# Patient Record
Sex: Female | Born: 1940 | Race: Black or African American | Hispanic: No | Marital: Single
Health system: Midwestern US, Community
[De-identification: ages and names within clinical notes are randomized; demographics above are authoritative.]

## PROBLEM LIST (undated history)

## (undated) DIAGNOSIS — I10 Essential (primary) hypertension: Secondary | ICD-10-CM

## (undated) DIAGNOSIS — M810 Age-related osteoporosis without current pathological fracture: Secondary | ICD-10-CM

## (undated) DIAGNOSIS — J449 Chronic obstructive pulmonary disease, unspecified: Secondary | ICD-10-CM

## (undated) DIAGNOSIS — R569 Unspecified convulsions: Secondary | ICD-10-CM

## (undated) DIAGNOSIS — I059 Rheumatic mitral valve disease, unspecified: Secondary | ICD-10-CM

## (undated) DIAGNOSIS — IMO0002 Reserved for concepts with insufficient information to code with codable children: Secondary | ICD-10-CM

## (undated) DIAGNOSIS — M329 Systemic lupus erythematosus, unspecified: Secondary | ICD-10-CM

## (undated) HISTORY — DX: Unspecified convulsions: R56.9

## (undated) HISTORY — PX: CHOLECYSTECTOMY: SHX55

## (undated) HISTORY — DX: Systemic lupus erythematosus, unspecified: M32.9

## (undated) HISTORY — DX: Essential (primary) hypertension: I10

## (undated) HISTORY — DX: Reserved for concepts with insufficient information to code with codable children: IMO0002

## (undated) HISTORY — DX: Rheumatic mitral valve disease, unspecified: I05.9

## (undated) HISTORY — PX: TUBAL LIGATION: SHX77

## (undated) MED ORDER — METOPROLOL SUCCINATE SR 200 MG 24 HR TAB
200 mg | ORAL_TABLET | Freq: Every day | ORAL | Status: DC
Start: ? — End: 2012-05-08

## (undated) MED ORDER — FLUTICASONE-SALMETEROL 250 MCG-50 MCG/DOSE DISK DEVICE FOR INHALATION
250-50 mcg/dose | Freq: Two times a day (BID) | RESPIRATORY_TRACT | Status: AC
Start: ? — End: 2012-08-06

## (undated) MED ORDER — WARFARIN 2 MG TAB
2 mg | ORAL_TABLET | ORAL | Status: DC
Start: ? — End: 2012-07-03

## (undated) MED ORDER — TRAZODONE 50 MG TAB
50 mg | ORAL_TABLET | Freq: Every evening | ORAL | Status: DC
Start: ? — End: 2012-05-08

## (undated) MED ORDER — DICYCLOMINE 10 MG CAP
10 mg | ORAL_CAPSULE | Freq: Three times a day (TID) | ORAL | Status: DC
Start: ? — End: 2013-05-30

## (undated) MED ORDER — CIPROFLOXACIN 500 MG TAB
500 mg | ORAL_TABLET | Freq: Two times a day (BID) | ORAL | Status: AC
Start: ? — End: 2012-03-31

## (undated) MED ORDER — ESOMEPRAZOLE MAGNESIUM 40 MG CAP, DELAYED RELEASE
40 mg | ORAL_CAPSULE | Freq: Every day | ORAL | Status: DC
Start: ? — End: 2013-05-30

## (undated) MED ORDER — WARFARIN 2 MG TAB: 2 mg | ORAL_TABLET | ORAL | Status: AC

## (undated) MED ORDER — SERTRALINE 50 MG TAB
50 mg | ORAL_TABLET | Freq: Every day | ORAL | Status: DC
Start: ? — End: 2012-08-02

## (undated) MED ORDER — FLUTICASONE 50 MCG/ACTUATION NASAL SPRAY, SUSP: 50 mcg/actuation | Freq: Every day | NASAL | Status: AC

## (undated) MED ORDER — DIGOXIN 0.125 MG TAB
0.125 mg | ORAL_TABLET | Freq: Every day | ORAL | Status: DC
Start: ? — End: 2013-05-30

## (undated) MED ORDER — DIGOXIN 0.125 MG TAB: 0.125 mg | ORAL_TABLET | Freq: Every day | ORAL | Status: AC

## (undated) MED ORDER — SERTRALINE 100 MG TAB: 100 mg | ORAL_TABLET | Freq: Every day | ORAL | Status: AC

## (undated) MED ORDER — LEVETIRACETAM 500 MG TAB
500 mg | ORAL_TABLET | Freq: Two times a day (BID) | ORAL | Status: AC
Start: ? — End: 2012-08-06

## (undated) MED ORDER — ESOMEPRAZOLE MAGNESIUM 40 MG CAP, DELAYED RELEASE
40 mg | ORAL_CAPSULE | Freq: Every day | ORAL | Status: DC
Start: ? — End: 2012-05-08

## (undated) MED ORDER — TRAZODONE 50 MG TAB
50 mg | ORAL_TABLET | Freq: Every evening | ORAL | Status: DC
Start: ? — End: 2013-07-16

## (undated) MED ORDER — MIRTAZAPINE 30 MG TAB
30 mg | ORAL_TABLET | ORAL | Status: DC
Start: ? — End: 2013-05-30

## (undated) MED ORDER — ALBUTEROL SULFATE HFA 90 MCG/ACTUATION AEROSOL INHALER: 90 mcg/actuation | Freq: Four times a day (QID) | RESPIRATORY_TRACT | Status: AC | PRN

## (undated) MED ORDER — DONEPEZIL 5 MG TAB
5 mg | ORAL_TABLET | Freq: Every evening | ORAL | Status: DC
Start: ? — End: 2013-05-30

## (undated) MED ORDER — FLUTICASONE-SALMETEROL 250 MCG-50 MCG/DOSE DISK DEVICE FOR INHALATION
250-50 mcg/dose | Freq: Two times a day (BID) | RESPIRATORY_TRACT | Status: DC
Start: ? — End: 2012-05-08

## (undated) MED ORDER — WARFARIN 2 MG TAB
2 mg | ORAL_TABLET | ORAL | Status: DC
Start: ? — End: 2012-08-14

## (undated) MED ORDER — SERTRALINE 50 MG TAB
50 mg | ORAL_TABLET | Freq: Every day | ORAL | Status: DC
Start: ? — End: 2012-05-08

## (undated) MED ORDER — MEMANTINE 5 MG TAB
5 mg | ORAL_TABLET | Freq: Every day | ORAL | Status: DC
Start: ? — End: 2013-05-30

## (undated) MED ORDER — PREDNISONE 10 MG TABLETS IN A DOSE PACK
10 mg | PACK | ORAL | Status: DC
Start: ? — End: 2013-05-30

## (undated) MED ORDER — TRAMADOL 50 MG TAB: 50 mg | ORAL_TABLET | Freq: Four times a day (QID) | ORAL | Status: AC | PRN

## (undated) MED ORDER — METOPROLOL SUCCINATE SR 200 MG 24 HR TAB
200 mg | ORAL_TABLET | Freq: Every day | ORAL | Status: DC
Start: ? — End: 2013-05-30

---

## 2009-06-08 LAB — EKG, 12 LEAD, INITIAL
Atrial Rate: 96 {beats}/min
Calculated P Axis: 59 degrees
Calculated R Axis: 21 degrees
Calculated T Axis: 96 degrees
P-R Interval: 140 ms
Q-T Interval: 370 ms
QRS Duration: 74 ms
QTC Calculation (Bezet): 467 ms
Ventricular Rate: 96 {beats}/min

## 2009-06-08 NOTE — ED Notes (Signed)
Called to the treatment area with no response

## 2009-06-08 NOTE — ED Notes (Signed)
Called to the treatment area with no response, patient apparently walked out without being seen

## 2009-06-08 NOTE — ED Notes (Signed)
TRIAGE NOTE:  Pt complains of cough, wheezing.  Also having HA.

## 2009-06-09 NOTE — ED Provider Notes (Signed)
The patient was never seen and evaluated by me while in the ED.

## 2010-08-27 NOTE — Progress Notes (Signed)
History and Physical    Subjective:     Alyssa Hawkins is a 70 y.o. with hx of hbp who is about 10 yr s/p ?AVR in [place] and maintianed on coumadin by Garn[facility] [place] referred by [facility] for connective tissue evaluation. Over the past few years Alyssa Hawkins has developed multiple black papular lesions on face and in scalp in additional to scattered excematoid-like lesions on arms and palms recently biopsied and treated with topical steroids. Path report is not available to me but lab has included nl cbc, u/a and cmp and + ANA, RNP and anti SS-A. The patients mother died with SLE at age 40.    Alyssa Hawkins/o painful toe tips, but no hx of Sicca, Raynaud's, alopecia, oral ulcers, pleurisy or significant joint pain.    Past Medical History   Diagnosis Date   ??? Hypertension    ??? COPD    ??? CAD (coronary artery disease)    ??? HTN (hypertension) 08/27/2010   ??? S/P AVR (aortic valve replacement) 08/27/2010      Past Surgical History   Procedure Date   ??? Valve replacement only each    ??? Hx aortic valve replacement    ??? Hx tubal ligation      No family history on file.   History   Substance Use Topics   ??? Smoking status: Current Everyday Smoker -- 1.0 packs/day   ??? Smokeless tobacco: Not on file   ??? Alcohol Use: Yes       Prior to Admission medications    Medication Sig Start Date End Date Taking? Authorizing Provider   escitalopram (LEXAPRO) 10 mg tablet Take 10 mg by mouth daily.     Yes Historical Provider   benazepril (LOTENSIN) 10 mg tablet Take  by mouth daily.     Yes Historical Provider   warfarin (COUMADIN) 1 mg tablet Take 1 mg by mouth daily.     Yes Historical Provider   metoprolol (LOPRESSOR) 50 mg tablet Take  by mouth two (2) times a day.     Yes Historical Provider     Allergies   Allergen Reactions   ??? Penicillins Unknown (comments)        Review of Systems  .Gen - no significant weight change, restorative sleep   HEENT - vital senses intact, no headache   CARDIOPUL - no cp, sob, cough    GI - no significant dyspepsia or pain, normal bowel habit   GU - no dysuria or unusual frequency   MUSCULOSKEL - no significant joint complaints      Objective:   BP 100/70   Ht 5\' 6"  (1.676 m)   Wt 139 lb (63.05 kg)   BMI 22.44 kg/m2      Physical Exam:   Heent -perrla  Neck -nl thyroid, no nodes  Breasts-neg  Lungs -clear  Heart -reg, prosthetic heart sound with systolic m base and apex  Abd -no masses, no bruit  Ext -no edema, no synovitis,feet are cool but not cyanotic  Skin - as decribed above    Data Review:   Results for orders placed during the hospital encounter of 06/08/09   EKG, 12 LEAD, INITIAL       Component Value Range    Ventricular Rate 96      Atrial Rate 96      P-R Interval 140      QRS Duration 74      Q-T Interval 370  QTC Calculation (Bezet) 467      Calculated P Axis 59      Calculated R Axis 21      Calculated T Axis 96      Diagnosis        Value: Sinus rhythm with occasional premature ventricular complexes      Possible Left atrial enlargement      Nonspecific T wave abnormality Prolonged QT            No previous ECGs available      Confirmed by Arnoldo Lenis, M.D., Massimo ([id number]) on 06/08/2009 3:58:39 PM       Assessment:     1. Discoid lupus    2. HTN (hypertension)    3. S/P AVR (aortic valve replacement)        Plan:     I find no evidence for systemic CTD at this time. Serology consistent with subacute lupus but does not have characteristic rash. Also the black papules are very unusual and will await bx results.Plaquenil probably a good choice but will have to monitor INR.I did not give return appt but will be interested in her course and available as needed.    Signed By: Prudencio Pair, MD     August 27, 2010

## 2010-12-30 DIAGNOSIS — I4891 Unspecified atrial fibrillation: Secondary | ICD-10-CM

## 2010-12-30 NOTE — ED Notes (Signed)
Pt will be transported with fluids infusing.

## 2010-12-30 NOTE — ED Notes (Signed)
1 hour of SOB and irregular rapid heart rate.

## 2010-12-30 NOTE — ED Notes (Signed)
Pt now in NSR 87.MD notified and new EKG ran.

## 2010-12-30 NOTE — H&P (Signed)
History and Physical dictated (# [id number])    Pearline Cables, MD  Hospitalist  [phone]

## 2010-12-30 NOTE — ED Notes (Signed)
She was at home with sob and chest discomfort and irregular heartbeat.  She is  Alert and oriente

## 2010-12-30 NOTE — ED Provider Notes (Addendum)
HPI Comments: 70 year old female with hx of valve replacement in 2000 (on Coumadin) and HTN presents to the ED for SOB and chest pain.  Pt reports sudden onset of rapid heart rate, SOB, and chest "pressure" at 1800 tonight.  Associated nausea, no vomiting.  No hx of the same, though she may have had some mild episodes before.  Pt denies syncope and leg swelling.    PCP: [facility]    Note written by Sherlon Handing, Scribe, as dictated by Cindi Carbon, MD 8:43 PM      The history is provided by the patient.        Past Medical History   Diagnosis Date   ??? Hypertension    ??? COPD    ??? CAD (coronary artery disease)    ??? HTN (hypertension) 08/27/2010   ??? S/P AVR (aortic valve replacement) 08/27/2010        Past Surgical History   Procedure Date   ??? Valve replacement only each    ??? Hx aortic valve replacement    ??? Hx tubal ligation          No family history on file.     History     Social History   ??? Marital Status: Single     Spouse Name: N/A     Number of Children: N/A   ??? Years of Education: N/A     Occupational History   ??? Not on file.     Social History Main Topics   ??? Smoking status: Current Everyday Smoker -- 1.0 packs/day   ??? Smokeless tobacco: Not on file   ??? Alcohol Use: Yes   ??? Drug Use:    ??? Sexually Active:      Other Topics Concern   ??? Not on file     Social History Narrative   ??? No narrative on file                  ALLERGIES: Penicillins      Review of Systems   Respiratory: Positive for shortness of breath.    Cardiovascular: Positive for chest pain and palpitations.   Gastrointestinal: Positive for nausea.   All other systems reviewed and are negative.        Filed Vitals:    12/30/10 2020 12/30/10 2032   BP: 98/68    Pulse:  140   Temp: 98.3 ??F (36.8 ??C)    Resp: 28    Height:  5\' 6"  (1.676 m)   Weight:  59.421 kg (131 lb)   SpO2: 98%             Physical Exam   Nursing note and vitals reviewed.   Constitutional: She is oriented to person, place, and time. She appears well-developed and well-nourished. No distress.   HENT:   Head: Normocephalic and atraumatic.   Eyes: Pupils are equal, round, and reactive to light.   Neck: Neck supple.   Cardiovascular: An irregular rhythm present. Tachycardia present.    No murmur heard.       Loud S2.   Pulmonary/Chest: Effort normal and breath sounds normal. No respiratory distress.   Abdominal: Soft. She exhibits no mass. There is no tenderness.   Musculoskeletal: Normal range of motion. She exhibits no edema.   Neurological: She is alert and oriented to person, place, and time.   Skin: Skin is warm and dry.   Psychiatric: She has a normal mood and affect. Her behavior is  normal.   Note written by Sherlon Handing, Scribe, as dictated by Cindi Carbon, MD 8:44 PM      MDM     Amount and/or Complexity of Data Reviewed:   Clinical lab tests:  Ordered and reviewed   Obtain history from someone other than the patient:  Yes   Discuss the patient with another provider:  Yes   Independant visualization of image, tracing, or specimen:  Yes  Risk of Significant Complications, Morbidity, and/or Mortality:   Presenting problems:  Moderate  Diagnostic procedures:  Moderate  Management options:  Moderate      Procedures    9:19 PM  EKG: (ED MD Interpretation)  A-fib, rate= 182, NSSTTW changes.  Note written by Sherlon Handing, Scribe, as interpreted by Cindi Carbon, MD 9:19 PM    9:20 PM  Repeat EKG after Cardizem: (ED MD Interpretation)  NSR, rate= 83, NSSTTW changes.  Note written by Sherlon Handing, Scribe, as interpreted by Cindi Carbon, MD 9:20 PM  1100 pm- repeat troponin 0.19 - have spoken c hospitalist - he will adm.

## 2010-12-30 NOTE — Other (Signed)
TRANSFER - OUT REPORT:    Verbal report given to Amanda(name) on Alyssa Hawkins  being transferred to [facility](unit) for routine progression of care       Report consisted of patient???s Situation, Background, Assessment and   Recommendations(SBAR).     Information from the following report(s) SBAR, ED Summary, [facility] and Recent Results was reviewed with the receiving nurse.    Opportunity for questions and clarification was provided.

## 2010-12-31 ENCOUNTER — Inpatient Hospital Stay
Admit: 2010-12-31 | Discharge: 2010-12-31 | Disposition: A | Discharge status: Home / Self Care | Payer: MEDICARE | Attending: Internal Medicine | Admitting: Internal Medicine

## 2010-12-31 LAB — METABOLIC PANEL, COMPREHENSIVE
A-G Ratio: 0.9 — ABNORMAL LOW (ref 1.1–2.2)
ALT (SGPT): 14 U/L (ref 12–78)
AST (SGOT): 30 U/L (ref 15–37)
Albumin: 4.2 g/dL (ref 3.5–5.0)
Alk. phosphatase: 75 U/L (ref 50–136)
Anion gap: 12 mmol/L (ref 5–15)
BUN/Creatinine ratio: 9 — ABNORMAL LOW (ref 12–20)
BUN: 11 MG/DL (ref 6–20)
Bilirubin, total: 0.7 MG/DL (ref 0.2–1.0)
CO2: 25 MMOL/L (ref 21–32)
Calcium: 9.1 MG/DL (ref 8.5–10.1)
Chloride: 101 MMOL/L (ref 97–108)
Creatinine: 1.2 MG/DL (ref 0.6–1.3)
GFR est AA: 57 mL/min/{1.73_m2} — ABNORMAL LOW (ref >60)
GFR est non-AA: 47 mL/min/{1.73_m2} — ABNORMAL LOW (ref >60)
Globulin: 4.7 g/dL — ABNORMAL HIGH (ref 2.0–4.0)
Glucose: 128 MG/DL — ABNORMAL HIGH (ref 65–100)
Potassium: 3.2 MMOL/L — ABNORMAL LOW (ref 3.5–5.1)
Protein, total: 8.9 g/dL — ABNORMAL HIGH (ref 6.4–8.2)
Sodium: 138 MMOL/L (ref 136–145)

## 2010-12-31 LAB — CBC WITH AUTOMATED DIFF
ABS. BASOPHILS: 0 10*3/uL (ref 0.0–0.1)
ABS. EOSINOPHILS: 0 10*3/uL (ref 0.0–0.4)
ABS. LYMPHOCYTES: 1.5 10*3/uL (ref 0.8–3.5)
ABS. MONOCYTES: 0.7 10*3/uL (ref 0.0–1.0)
ABS. NEUTROPHILS: 3.6 10*3/uL (ref 1.8–8.0)
BASOPHILS: 0 % (ref 0–1)
EOSINOPHILS: 0 % (ref 0–7)
HCT: 38.8 % (ref 35.0–47.0)
HGB: 13 g/dL (ref 11.5–16.0)
LYMPHOCYTES: 26 % (ref 12–49)
MCH: 27.5 PG (ref 26.0–34.0)
MCHC: 33.5 g/dL (ref 30.0–36.5)
MCV: 82 FL (ref 80.0–99.0)
MONOCYTES: 11 % (ref 5–13)
NEUTROPHILS: 63 % (ref 32–75)
PLATELET: 222 10*3/uL (ref 150–400)
RBC: 4.73 M/uL (ref 3.80–5.20)
RDW: 14.7 % — ABNORMAL HIGH (ref 11.5–14.5)
WBC: 5.8 10*3/uL (ref 3.6–11.0)

## 2010-12-31 LAB — EKG, 12 LEAD, INITIAL
Atrial Rate: 170 {beats}/min
Atrial Rate: 83 {beats}/min
Atrial Rate: 64 {beats}/min
Calculated P Axis: 44 degrees
Calculated P Axis: 51 degrees
Calculated R Axis: 12 degrees
Calculated R Axis: 20 degrees
Calculated R Axis: 36 degrees
Calculated T Axis: 127 degrees
Calculated T Axis: 64 degrees
Calculated T Axis: 75 degrees
Diagnosis: NORMAL
Diagnosis: NORMAL
P-R Interval: 164 ms
P-R Interval: 168 ms
Q-T Interval: 240 ms
Q-T Interval: 390 ms
Q-T Interval: 480 ms
QRS Duration: 68 ms
QRS Duration: 80 ms
QRS Duration: 90 ms
QTC Calculation (Bezet): 417 ms
QTC Calculation (Bezet): 458 ms
QTC Calculation (Bezet): 495 ms
Ventricular Rate: 182 {beats}/min
Ventricular Rate: 83 {beats}/min
Ventricular Rate: 64 {beats}/min

## 2010-12-31 LAB — CK W/ CKMB & INDEX
CK - MB: 1 NG/ML (ref 0.5–3.6)
CK-MB Index: 0.7 (ref 0–2.5)
CK: 141 U/L (ref 26–192)

## 2010-12-31 LAB — MAGNESIUM
Magnesium: 1.7 MG/DL (ref 1.6–2.4)
Magnesium: 1.7 MG/DL (ref 1.6–2.4)

## 2010-12-31 LAB — TROPONIN I
Troponin-I, Qt.: 0.06 ng/mL — ABNORMAL HIGH (ref <0.05)
Troponin-I, Qt.: 0.19 ng/mL — ABNORMAL HIGH (ref <0.05)
Troponin-I, Qt.: 0.58 ng/mL — ABNORMAL HIGH (ref <0.05)
Troponin-I, Qt.: 0.29 ng/mL — ABNORMAL HIGH (ref <0.05)

## 2010-12-31 LAB — TSH 3RD GENERATION: TSH: 1.26 u[IU]/mL (ref 0.36–3.74)

## 2010-12-31 LAB — PROTHROMBIN TIME + INR
INR: 3.1 — ABNORMAL HIGH (ref 0.9–1.1)
INR: 2.9 — ABNORMAL HIGH (ref 0.9–1.1)
Prothrombin time: 31.9 s — ABNORMAL HIGH (ref 9.4–11.7)
Prothrombin time: 30.2 s — ABNORMAL HIGH (ref 9.4–11.7)

## 2010-12-31 LAB — HEMOGLOBIN A1C WITH EAG
Est. average glucose: 128 mg/dL
Hemoglobin A1c: 6.1 % (ref 4.2–6.3)

## 2010-12-31 LAB — PTT: aPTT: 53.6 s — ABNORMAL HIGH (ref 24.0–31.5)

## 2010-12-31 MED ORDER — PANTOPRAZOLE 40 MG TAB, DELAYED RELEASE
40 mg | Freq: Every day | ORAL | Status: DC
Start: 2010-12-31 — End: 2010-12-31
  Administered 2010-12-31: 13:00:00 via ORAL

## 2010-12-31 MED ORDER — ASPIRIN 81 MG TAB, DELAYED RELEASE
81 mg | Freq: Every day | ORAL | Status: DC
Start: 2010-12-31 — End: 2010-12-31
  Administered 2010-12-31: 13:00:00 via ORAL

## 2010-12-31 MED ORDER — ASPIRIN 81 MG CHEWABLE TAB
81 mg | ORAL | Status: AC
Start: 2010-12-31 — End: 2010-12-30
  Administered 2010-12-31: 04:00:00 via ORAL

## 2010-12-31 MED ORDER — NS WITH POTASSIUM CHLORIDE 20 MEQ/L IV
20 mEq/L | INTRAVENOUS | Status: AC
Start: 2010-12-31 — End: 2010-12-31
  Administered 2010-12-31: 05:00:00 via INTRAVENOUS

## 2010-12-31 MED ORDER — NICOTINE 21 MG/24 HR DAILY PATCH
21 mg/24 hr | MEDICATED_PATCH | Freq: Every day | TRANSDERMAL | Status: DC
Start: 2010-12-31 — End: 2011-01-18

## 2010-12-31 MED ORDER — ESCITALOPRAM 10 MG TAB
10 mg | Freq: Every day | ORAL | Status: DC
Start: 2010-12-31 — End: 2010-12-31
  Administered 2010-12-31: 13:00:00 via ORAL

## 2010-12-31 MED ORDER — PANTOPRAZOLE 40 MG TAB, DELAYED RELEASE
40 mg | Freq: Once | ORAL | Status: AC
Start: 2010-12-31 — End: 2010-12-30
  Administered 2010-12-31: 04:00:00 via ORAL

## 2010-12-31 MED ORDER — ONDANSETRON (PF) 4 MG/2 ML INJECTION
4 mg/2 mL | INTRAMUSCULAR | Status: DC | PRN
Start: 2010-12-31 — End: 2010-12-31

## 2010-12-31 MED ORDER — NICOTINE 21 MG/24 HR DAILY PATCH
21 mg/24 hr | Freq: Every day | TRANSDERMAL | Status: DC
Start: 2010-12-31 — End: 2010-12-31

## 2010-12-31 MED ADMIN — diltiazem (CARDIZEM) 125 mg in dextrose 5% 125 mL infusion: INTRAVENOUS | @ 01:00:00 | NDC 10019051004

## 2010-12-31 MED ADMIN — zolpidem (AMBIEN) tablet 10 mg: ORAL | @ 05:00:00 | NDC 68084022611

## 2010-12-31 MED ADMIN — warfarin (COUMADIN) tablet 2 mg: ORAL | @ 20:00:00 | NDC 00056016901

## 2010-12-31 MED ADMIN — diltiazem (CARDIZEM) 125 mg in dextrose 5% 125 mL infusion: INTRAVENOUS | @ 08:00:00 | NDC 10019051004

## 2010-12-31 MED ADMIN — fluticasone-salmeterol (ADVAIR) 250mcg-50mcg/puff: RESPIRATORY_TRACT | @ 13:00:00 | NDC 00173069600

## 2010-12-31 MED ADMIN — diltiazem (CARDIZEM) 125 mg in dextrose 5% 125 mL infusion: INTRAVENOUS | @ 13:00:00 | NDC 68462056201

## 2010-12-31 MED ADMIN — diltiazem (CARDIZEM) injection 10 mg: INTRAVENOUS | @ 01:00:00 | NDC 00409117101

## 2010-12-31 MED ADMIN — spironolactone (ALDACTONE) tablet 25 mg: ORAL | @ 13:00:00 | NDC 68084020611

## 2010-12-31 NOTE — Progress Notes (Addendum)
TRANSFER - IN REPORT:    Verbal report received from Carlette, RN(name) on Alyssa Hawkins  being received from ED(unit) for routine progression of care      Report consisted of patient???s Situation, Background, Assessment and   Recommendations(SBAR).     Information from the following report(s) SBAR, Kardex, ED Summary, MAR, [facility] and Recent Results was reviewed with the receiving nurse.    Opportunity for questions and clarification was provided.      Assessment completed upon patient???s arrival to unit and care assumed.

## 2010-12-31 NOTE — Progress Notes (Signed)
0400-- Pts SBP in the 90s. MD paged  0422-- s/w Dr.Gavris, received orders to decrease cardizem rate to  5 ml/hr.    Will continue to assess and monitor pt.

## 2010-12-31 NOTE — Progress Notes (Signed)
Hospitalist Teaching Service  Family Medicine Resident Progress Note    Patient seen and examined.  Agree with resident's plans.  Please see my additional comments at the bottom of the note.    Daily Progress Note: 12/31/2010  PCP: Not On File     Assessment/Plan:   1. A Fib: pt now in NSR, maybe due to underlying ischemia although nonspecific EKG changes.. Cardiology consulted. Continue metoprolol. Will continue to wean her off Cardizem. Cardiac enzymes normal.   2. Troponin's elevated: Maybe due to coronary ischemia or demand ischemia from RVR. Will await cardiology advise.  3. Hypokalemia: replete with potasium. Will recheck in am  4. Hyperglycemia: does not Hx of DM. Hb A1 c was 6.1.    VTE prophylaxis: coumadin  Discussed plan of care with Patient/Family and Nurse   Disposition:  discharge to home when stable.     Subjective:   Pt is doing well. She had a comfortable night.  Denies SOB, CP, nausea, vomiting, fevers, chills.    No issues overnight.      Objective:   Physical examination  BP 99/50   Pulse 68   Temp 98.4 ??F (36.9 ??C)   Resp 16   Ht 5\' 6"  (1.676 m)   Wt 132 lb 11.5 oz (60.2 kg)   BMI 21.42 kg/m2   SpO2 98%   Temp (24hrs), Avg:98.1 ??F (36.7 ??C), Min:97.5 ??F (36.4 ??C), Max:98.4 ??F (36.9 ??C)         O2 Device: Room air      Intake/Output Summary (Last 24 hours) at 12/31/10 1610  Last data filed at 12/31/10 0145   Gross per 24 hour   Intake      0 ml   Output    200 ml   Net   -200 ml     Last shift:       Last 3 shifts:    08/29 1900 - 08/31 0659  In: -   Out: 200 [Urine:200]    General:   Alert, cooperative, no acute distress   Head:   Atraumatic   Eyes:   Conjunctivae clear   ENT:  Oral mucosa normal   Neck:  Supple, trachea midline, no adenopathy   No JVD   Back:    No CVA tenderness    Chest wall:    No tenderness or deformities    Lungs:   Clear to auscultation bilaterally    Heart:   Regular rhythm, click present   Abdomen:    Soft, non-tender   No masses or organomegaly     Extremities:  No edema or DVT signs   Pulses:  Symmetric all extremities   Neurologic:  Oriented   No focal deficits     Data Review:   Reviewed labs, Chest X ray  Telemetry reviewed    Recent Labs   [facility] 12/30/10 2045    WBC 5.8    HGB 13.0    HCT 38.8    PLT 222     Recent Labs   Basename 12/31/10 0400 12/30/10 2145 12/30/10 2045    NA -- -- 138    K -- -- 3.2*    CL -- -- 101    CO2 -- -- 25    GLU -- -- 128*    BUN -- -- 11    CREA -- -- 1.2    CA -- -- 9.1    MG -- 1.7 1.7    PHOS -- -- --  ALB -- -- 4.2    TBIL -- -- 0.7    SGOT -- -- 30    INR 2.9* -- 3.1*     Medications reviewed  Current Facility-Administered Medications   Medication Dose Route Frequency   ??? escitalopram (LEXAPRO) tablet 10 mg  10 mg Oral DAILY   ??? pantoprazole (PROTONIX) tablet 40 mg  40 mg Oral DAILY   ??? fluticasone-salmeterol (ADVAIR) 248mcg-50mcg/puff  1 Puff Inhalation Q12H   ??? metoprolol-XL (TOPROL-XL) tablet 100 mg  100 mg Oral BID   ??? spironolactone (ALDACTONE) tablet 25 mg  25 mg Oral DAILY   ??? aspirin delayed-release tablet 81 mg  81 mg Oral DAILY   ??? ondansetron (ZOFRAN) injection 4 mg  4 mg IntraVENous Q4H PRN   ??? nicotine (NICODERM CQ) 21 mg/24 hr patch 1 Patch  1 Patch TransDERmal DAILY   ??? 0.9% sodium chloride with KCl 20 mEq/L infusion   IntraVENous CONTINUOUS   ??? WARFARIN INFORMATION NOTE (COUMADIN)   Other PRN   ??? zolpidem (AMBIEN) tablet 10 mg  10 mg Oral QHS PRN   ??? diltiazem (CARDIZEM) 125 mg in dextrose 5% 125 mL infusion  5 mg/hr IntraVENous CONTINUOUS   ??? diltiazem (CARDIZEM) injection 10 mg  10 mg IntraVENous ONCE   ??? aspirin chewable tablet 162 mg  162 mg Oral NOW   ??? pantoprazole (PROTONIX) tablet 40 mg  40 mg Oral ONCE   ??? DISCONTD: diltiazem (CARDIZEM) 125 mg in dextrose 5% 125 mL infusion  10 mg/hr IntraVENous CONTINUOUS   ??? DISCONTD: diltiazem (CARDIZEM) injection 10 mg  10 mg IntraVENous NOW         Signed:   Peggyann Shoals, MD   Resident, Family Medicine      Attending note:    Patient seen and examined.  She is now back in NSR.  Appreciate cardiology assistance.  D/w Dr. Gasper Lloyd.  Plan to d/c home on prior cardiac regimen and coumadin.  She will f/u with Dr. Gasper Lloyd next week for further eval.

## 2010-12-31 NOTE — Progress Notes (Signed)
Location: 4[facility] - 45001  Attn.: Cheral Almas  DOB: 1940/10/14 / Age: 70  MR#: [id number] / Admit#: [id number]  Pt. First Name: Ingra  Pt. Last Name: Sharrie Rothman [facility]  [address]  [place], [place]  [address]                        Case Management - Progress Note  Initial Open Date: 12/30/2010  Case Manager: Melina Fiddler    Initial Open Date:  Social Worker: Derenda Fennel  Expected Date of Discharge: 01/01/2011  Transferred From: home  ECF Bed Held Until:  Bed Held By:  Power of Attorney: child-keena [phone]  POA/Guardian/Conservator Capacity:  Primary Caregiver:  Living Arrangements: Own Home  Source of Income:  Payee:  Psychosocial History:  Cultural/Religious/Language Issues:  Education Level:  ADLS/Current Living Arrangements Issues: self care pta;  Past Providers:  Will patient perform self care at discharge? Y  Anticipated Discharge Disposition Goal: Return to admission address  Assessment/Plan:   12/31/2010 12:08P: Chart reviewed for medical necessity.  No discharge needs identified at this time, but will continue to assess and  refer to sw as needed.  Following for medical necessity and care management  needs.  S.Jaely Silman,RN,CRM  Resources at Discharge:  Service Providers at Discharge:  Dictating Provider:

## 2010-12-31 NOTE — Progress Notes (Signed)
Echocardiogram completed.

## 2010-12-31 NOTE — Discharge Summary (Shared)
Discharge Summary     Patient: Alyssa Hawkins       MRN: [id number]       Date of birth: October 10, 1940       Age: 70 y.o.     Date of admission:  12/30/2010    Date of discharge:  12/31/2010    Primary care provider: Dr. Garnette Gunner, [facility] [place]    Admitting provider:  Pearline Cables, MD    Discharging provider(s): Peggyann Shoals, MD - [facility] Medicine Resident   Hospitalist Attending, Internal Medicine     Consultations  ?? IP CONSULT TO CARDIOLOGY    Discharge destination: .  The patient is stable for discharge. Pt lives with her daughter and grandchildren.    Admission diagnosis  ?? Atrial fibrillation    Please refer to the admission history and physical for details on the presenting problem.     Final discharge diagnoses and brief hospital course    1. .A Fib: maybe due to underlying ischemia although nonspecific EKG changes. Pt now in NSR  with no complaints of chest pain, SOB or lower extremity edema . Cardiology advised ECHO.  Continue metoprolol. Was on Cardizem drip which relieved the symptoms. Cardiac enzymes normal.   2. Troponin's elevated: Maybe due to coronary ischemia or demand ischemia from RVR. Cardiology is going to decide Whether or not to do Cath or Stress test.  3. Hypokalemia: repleted with potasium.   4. Mild Hyperglycemia: does not have history of DM. Hb A1 c was 6.1.        5.   VTE prophylaxis: coumadin       ACUTE DIAGNOSES:  ?? Atrial fibrillation  . . . . . . . . . . . . . . . . . . . . . . . . . . . . . . . . . . . . . . . . . . . . . . . . . . . . . . . . . . . . . . . . . . . . . . . .   Current Discharge Medication List      CONTINUE these medications which have NOT CHANGED    Details   metoprolol-XL (TOPROL-XL) 200 mg XL tablet Take 200 mg by mouth daily.        spironolactone (ALDACTONE) 25 mg tablet Take  by mouth daily.         fluticasone-salmeterol (ADVAIR DISKUS) 250-50 mcg/dose diskus inhaler Take 1 Puff by inhalation every twelve (12) hours.        esomeprazole (NEXIUM) 40 mg capsule Take  by mouth daily.        escitalopram (LEXAPRO) 10 mg tablet Take 10 mg by mouth daily.        warfarin (COUMADIN) 1 mg tablet Take 1 mg by mouth daily. 2 mg Tue and Thur, alternating with 1 mg daily       Nicoderm CQ Patch 21mg /24 hr .  1 Patch transdermal route daily for 30 days   Dispense 30 , R- 0     STOP taking these medications       losartan (COZAAR) 100 mg tablet Comments:   Reason for Stopping:                   FOLLOW-UP CARE RECOMMENDATIONS:    Appointments  ?? Follow-up with Cardiologist, Dr. Gasper Lloyd-  Please call shortly after discharge to set up an appointment to be  seen in 1 week. Their contact number is [phone]  ?? Follow up with PCP, Dr. Garnette Gunner in 1 week. Please make an appointment.     Follow-up tests needed: INR.     Pending test results:   At the time of your discharge the following test results are still pending: ECHO   Please make sure you review these results with your outpatient follow-up provider(s).    Specific symptoms to watch for: chest pain, shortness of breath, fever, chills, nausea, vomiting, diarrhea, change in mentation, falling, weakness, bleeding.     DIET/what to eat:  Cardiac Diet and Low fat, Low cholesterol    ACTIVITY:  activity as tolerated      Physical examination at discharge  General appearance - alert, well appearing, and in no distress and oriented to person, place, and time  Neck - supple, no significant adenopathy, no JVD,   Chest - clear to auscultation, no wheezes, rales or rhonchi, symmetric air entry  Heart - normal rate, regular rhythm, normal S1, S2, sharp valvular click  Abdomen - soft, nontender, nondistended, no masses or organomegaly  Extremities- no edema or DVT signs  Neurological - alert, oriented, normal speech, no focal findings or movement disorder noted      Visit Vitals    Item Reading   ??? BP 121/52   ??? Pulse 67   ??? Temp 98.4 ??F (36.9 ??C)   ??? Resp 16   ??? Ht 5\' 6"  (1.676 m)   ??? Wt 132 lb 11.5 oz (60.2 kg)   ??? BMI 21.42 kg/m2   ??? SpO2 94%          Recent Labs   Basename 12/30/10 2045    WBC 5.8    HGB 13.0    HCT 38.8    PLT 222     Recent Labs   [facility] 12/30/10 2145 12/30/10 2045    NA -- 138    K -- 3.2*    CL -- 101    CO2 -- 25    BUN -- 11    CREA -- 1.2    GLU -- 128*    CA -- 9.1    MG 1.7 1.7    PHOS -- --    URICA -- --     Recent Labs   [facility] 12/30/10 2045    SGOT 30    GPT 14    AP 75    TBIL 0.7    TP 8.9*    ALB 4.2    GLOB 4.7*    GGT --    AML --    LPSE --     Recent Labs   Basename 12/31/10 0400 12/30/10 2045    INR 2.9* 3.1*    PTP 30.2* 31.9*    APTT -- --      No results found for this basename: FE:2,TIBC:2,PSAT:2,FERR:2, in the last 72 hours   No results found for this basename: PH:2,PCO2:2,PO2:2, in the last 72 hours  Recent Labs   [facility] 12/30/10 2045    CPK 141    [facility] --     No results found for this basename: [facility]       Pertinent imaging studies:  Chest  X Ray, Portable:  Frontal chest image  shows no acute cardiopulmonary abnormalities.  IMPRESSION: No acute process.       ---------------------------------    Chronic Diagnoses:    Problem List as of 12/31/2010  Date Reviewed: 12/30/2010  Codes Class Noted - Resolved    S/P MVR (mitral valve replacement) V43.3  Unknown - Present        *Atrial fibrillation 427.31  12/30/2010 - Present        HTN (hypertension) 401.9  08/27/2010 - Present              Time spent on discharge related activities today greater than 30 minutes.      Signed:      Peggyann Shoals, MD   Family Medicine Resident      12/31/2010   4:19 PM       Hospitalist Attending

## 2010-12-31 NOTE — Consults (Signed)
Alyssa Hawkins is a 70 y.o. female  Cardiology Consult  She had a mitral valve replacement in 2000 in [place] and is on warfarin  She has not seen a cardiologist here but does get here INR checked regularly  She had sudden onset of tachycardia and shortness of breath  And found to be in rapid atrial fibrillation with PVCs  Now back to NSR and denies having had any chest pain or lower extremity edema     Elevated troponin going up to 0.58    Impression:  Needs echo to see what EF is before can really decide on what further meds  Rapid afib now in NSR already on high dose of metoprolol   Elevated troponin , not clear if it has peaked yet , recheck asap, if elevated then will need cath once warfarin is stopped  If similar then consider stress test since cp free       PCP=Not On File      Past Medical History   Diagnosis Date   ??? Hypertension    ??? COPD    ??? CAD (coronary artery disease)    ??? HTN (hypertension) 08/27/2010   ??? S/P AVR (aortic valve replacement) 08/27/2010        Past Surgical History   Procedure Date   ??? Valve replacement only each    ??? Hx aortic valve replacement    ??? Hx tubal ligation      History   Substance Use Topics   ??? Smoking status: Current Everyday Smoker -- 1.0 packs/day   ??? Smokeless tobacco: Not on file   ??? Alcohol Use: Yes    family history is not on file.    is allergic to penicillins.      Prescriptions prior to admission   Medication Sig   ??? metoprolol-XL (TOPROL-XL) 200 mg XL tablet Take 200 mg by mouth daily.     ??? losartan (COZAAR) 100 mg tablet Take 100 mg by mouth daily.     ??? spironolactone (ALDACTONE) 25 mg tablet Take  by mouth daily.     ??? fluticasone-salmeterol (ADVAIR DISKUS) 250-50 mcg/dose diskus inhaler Take 1 Puff by inhalation every twelve (12) hours.     ??? esomeprazole (NEXIUM) 40 mg capsule Take  by mouth daily.     ??? escitalopram (LEXAPRO) 10 mg tablet Take 10 mg by mouth daily.      ??? warfarin (COUMADIN) 1 mg tablet Take 1 mg by mouth daily. 2 mg Tue and Thur, alternating with 1 mg daily      BP 88/47   Pulse 67   Temp 98.8 ??F (37.1 ??C)   Resp 16   Ht 5\' 6"  (1.676 m)   Wt 132 lb 11.5 oz (60.2 kg)   BMI 21.42 kg/m2   SpO2 94%  Constitutional:  NAD, comfortable , well-developed , well-nourished.   HENT: Head: NC,ATEyes: No scleral icterus.   Neck:  Neck supple. No JVD present. No tracheal deviation,mass    Lungs:breath sounds normal. No stridor. distress, wheezes or  rales.Chest: Effort normal & normal respiratory excursion  Cardiovascular: Normal rate, regular rhythm, normal heart sounds . Exam reveals no gallop and no friction rub. PMI non displaced.   Murmur: 2/6 sys murmur    Edema: No edema  Extremities:  no clubbing or cyanosis.  Abdominal:  no abnormal distension.   Neurological: alert, conversant and oriented . Coordination seems grossly normal.   Skin: Skin is not cold. No obvious systemic  rash noted. Not diaphoretic. No erythema. Psychiatric:  Grossly normal mood and affect.  Behavior appears normal.      Recent Results (from the past 24 hour(s))   EKG, 12 LEAD, INITIAL    Collection Time    12/30/10  8:29 PM       Component Value Range    Ventricular Rate 182      Atrial Rate 170      QRS Duration 68      Q-T Interval 240      QTC Calculation (Bezet) 417      Calculated R Axis 12      Calculated T Axis 127      Diagnosis        Value: ** Poor data quality, interpretation may be adversely affected      Atrial fibrillation with premature ventricular or aberrantly conducted       complexes      ST & T wave abnormality, consider inferior ischemia      When compared with ECG of 08-Jun-2009 11:48,      Atrial fibrillation has replaced Sinus rhythm      Vent. rate has increased BY  86 BPM      ST more depressed in Inferior leads      ST now depressed in Anterior leads      Nonspecific T wave abnormality now evident in Inferior leads      T wave amplitude has increased in Anterior leads       Confirmed by Gasper Lloyd, M.D., Maniya Donovan ([id number]) on 12/31/2010 12:00:53 PM   CBC WITH AUTOMATED DIFF    Collection Time    12/30/10  8:45 PM       Component Value Range    WBC 5.8  3.6 - 11.0 (K/uL)    RBC 4.73  3.80 - 5.20 (M/uL)    HGB 13.0  11.5 - 16.0 (g/dL)    HCT 62.1  30.8 - 65.7 (%)    MCV 82.0  80.0 - 99.0 (FL)    MCH 27.5  26.0 - 34.0 (PG)    MCHC 33.5  30.0 - 36.5 (g/dL)    RDW 84.6 (*) 96.2 - 14.5 (%)    PLATELET 222  150 - 400 (K/uL)    NEUTROPHILS 63  32 - 75 (%)    LYMPHOCYTES 26  12 - 49 (%)    MONOCYTES 11  5 - 13 (%)    EOSINOPHILS 0  0 - 7 (%)    BASOPHILS 0  0 - 1 (%)    ABS. NEUTROPHILS 3.6  1.8 - 8.0 (K/UL)    ABS. LYMPHOCYTES 1.5  0.8 - 3.5 (K/UL)    ABS. MONOCYTES 0.7  0.0 - 1.0 (K/UL)    ABS. EOSINOPHILS 0.0  0.0 - 0.4 (K/UL)    ABS. BASOPHILS 0.0  0.0 - 0.1 (K/UL)   CK W/ CKMB & INDEX    Collection Time    12/30/10  8:45 PM       Component Value Range    CK 141  26 - 192 (U/L)    CK - MB 1.0  0.5 - 3.6 (NG/ML)    CK-MB Index 0.7  0 - 2.5 ( )   METABOLIC PANEL, COMPREHENSIVE    Collection Time    12/30/10  8:45 PM       Component Value Range    Sodium 138  136 - 145 (MMOL/L)    Potassium 3.2 (*) 3.5 - 5.1 (MMOL/L)  Chloride 101  97 - 108 (MMOL/L)    CO2 25  21 - 32 (MMOL/L)    Anion gap 12  5 - 15 (mmol/L)    Glucose 128 (*) 65 - 100 (MG/DL)    BUN 11  6 - 20 (MG/DL)    Creatinine 1.2  0.6 - 1.3 (MG/DL)    BUN/Creatinine ratio 9 (*) 12 - 20 ( )    GFR est AA 57 (*) >60 (ml/min/1.48m2)    GFR est non-AA 47 (*) >60 (ml/min/1.19m2)    Calcium 9.1  8.5 - 10.1 (MG/DL)    Bilirubin, total 0.7  0.2 - 1.0 (MG/DL)    ALT 14  12 - 78 (U/L)    AST 30  15 - 37 (U/L)    Alk. phosphatase 75  50 - 136 (U/L)    Protein, total 8.9 (*) 6.4 - 8.2 (g/dL)    Albumin 4.2  3.5 - 5.0 (g/dL)    Globulin 4.7 (*) 2.0 - 4.0 (g/dL)    A-G Ratio 0.9 (*) 1.1 - 2.2 ( )   TROPONIN I    Collection Time    12/30/10  8:45 PM       Component Value Range    Troponin-I, Qt. 0.06 (*) <0.05 (ng/mL)   PROTHROMBIN TIME     Collection Time    12/30/10  8:45 PM       Component Value Range    INR 3.1 (*) 0.9 - 1.1 ( )    Prothrombin time 31.9 (*) 9.4 - 11.7 (sec)   PTT    Collection Time    12/30/10  8:45 PM       Component Value Range    aPTT 53.6 (*) 24.0 - 31.5 (sec)    aPTT, therapeutic range      58.0 - 77.0 (SECS)   MAGNESIUM    Collection Time    12/30/10  8:45 PM       Component Value Range    Magnesium 1.7  1.6 - 2.4 (MG/DL)   HEMOGLOBIN Z6X    Collection Time    12/30/10  8:45 PM       Component Value Range    Hemoglobin A1C 6.1  4.2 - 6.3 (%)    Est. average glucose 128     EKG, 12 LEAD, INITIAL    Collection Time    12/30/10  9:14 PM       Component Value Range    Ventricular Rate 83      Atrial Rate 83      P-R Interval 164      QRS Duration 80      Q-T Interval 390      QTC Calculation (Bezet) 458      Calculated P Axis 44      Calculated R Axis 20      Calculated T Axis 64      Diagnosis        Value: Normal sinus rhythm      When compared with ECG of 30-Dec-2010 20:29,      Significant changes have occurred      Confirmed by Gasper Lloyd, M.D., Bexleigh Theriault ([id number]) on 12/31/2010 11:59:14 AM   TROPONIN I    Collection Time    12/30/10  9:45 PM       Component Value Range    Troponin-I, Qt. 0.19 (*) <0.05 (ng/mL)   MAGNESIUM    Collection Time    12/30/10  9:45 PM  Component Value Range    Magnesium 1.7  1.6 - 2.4 (MG/DL)   TSH, 3RD GENERATION    Collection Time    12/31/10 12:45 AM       Component Value Range    TSH 1.26  0.36 - 3.74 (uIU/mL)   TROPONIN I    Collection Time    12/31/10  3:30 AM       Component Value Range    Troponin-I, Qt. 0.58 (*) <0.05 (ng/mL)   PROTHROMBIN TIME    Collection Time    12/31/10  4:00 AM       Component Value Range    INR 2.9 (*) 0.9 - 1.1 ( )    Prothrombin time 30.2 (*) 9.4 - 11.7 (sec)       Wt Readings from Last 3 Encounters:   12/31/10 132 lb 11.5 oz (60.2 kg)   08/27/10 139 lb (63.05 kg)   06/08/09 136 lb (61.689 kg)      BP Readings from Last 3 Encounters:   12/31/10 88/47   08/27/10 100/70    06/08/09 144/75

## 2010-12-31 NOTE — Other (Signed)
Cardiac Multidisciplinary Rounds  12/31/2010           Patient Information:  NAME:   Alyssa Hawkins             AGE:   70 y.o.        ADMISSION DATE:   12/30/2010              ADMITTING PHYSICIAN:   Pearline Cables, MD               *CARDIOLOGIST:    Consult to be called this am           ________________________________________________________________________  ________________________________________________________________________         Primary Cardiac Diagnosis:          []                        CHF    [x]                        Arrhythmia     []                        Acute MI              [x]                        Chest Pain            []                       Cardiac Surgery           Problem List: (Address Problems Pertinent to This Admission Only)        Admission Diagnosis: Atrial fibrillation            Problem List:   Patient Active Problem List   Diagnoses Code   ??? HTN (hypertension) 401.9   ??? S/P AVR (aortic valve replacement) V43.3   ??? Atrial fibrillation 427.31             __________________________________________________________________   __________________________________________________________________      EF:   Not noted             ___________________________________________________________________  ___________________________________________________________________    Cardiac Surgery or Procedure:     none  Date: none             ___________________________________________________________________  ___________________________________________________________________      CORE MEASURE MEDICATIONS : (include dosage with meds as indicated below)        *ACE/ARB if EF <40%:   none                                       If none, has MD documented why?         *Beta Blocker :    metoprolol-XL (TOPROL-XL) tablet 100 mg    Freq: 2 TIMES DAILY  Route: Oral  Order Dose: 100 mg                                If none, has MD documented why?             ASA :   aspirin delayed-release tablet 81 mg     Freq:  DAILY  Route: Oral  Order Dose: 81 mg           *ASA on arrival ( if AMI ) :      yes                                        If AMI and none given,has MD documented why?           Statin :  none        If AMI and none ordered, has MD documented why?               Cardiac Surgery Patients:     Insulin Protocol (glucose average <120 surgery to POD 3):          ___________________________________________________________________  ___________________________________________________________________            Vaccination Information:      There is no immunization history on file for this patient.         a) PNA:    b) FLU:        If patient declines, is it documented? []                    Yes            []                    No         ___________________________________________________________________  ___________________________________________________________________                             [facility]      a) Central Line:  []                         Placement Date:                Clinical reason for continuing PICC :                    b) Foley Catheter:   []                          Placement Date:           Clinical reason for continuing foley :             Cardiac Surgery Pts: foley d/c'd by POD 2?  []                    Yes  []                    No        ______________________________________________________________________  ______________________________________________________________________             Activity Level:      Up w/ assist           DVT Prophylaxis (if indicated):            []                    Anticoagulation      []                    SCDs      ______________________________________________________________________  ______________________________________________________________________  Discharge Planning:      a) Pt Admitted From:     home      b) Discharge to:      [x]                   Home      []                   Rehab      []                   SNIF       []                    Home with Home Health      c) Support system -   family      ______________________________________________________________________  ______________________________________________________________________         Consults:    a) Cardiac Rehab  []                          b) Nutrition   []                         c) Care Management []                          d) Palliative Care Consult   []                        ( Consider for communication of goals of care, assistance with impaired family dynamics, poor support system, symptom management assoc with chronic illness such as pain, anxiety, GI symptoms)        ______________________________________________________________________  ______________________________________________________________________         Care Plan/Pathway:   (which ones are initiated and updated?)          CHF []                         AMI  []                         CABG  []                         Cardiac Surgery Valve []                         Cath Lab procedure []                         Other:     Chest pain               History of Smoking within the past year?:                    [x]                    Yes      []                    No        Smoking Cessation Education Provided and documented:      []                    Yes      []   No          ______________________________________________________________________          Patient / Family Goals For Today:  monitor, be seen by cardio

## 2010-12-31 NOTE — H&P (Signed)
Name:       Alyssa Hawkins, Alyssa Hawkins               Admitted:    12/30/2010    Account #:  [id number]                     DOB:         11-22-1940  Physician:  Cheral Almas, MD                 Age:         70                               HISTORY AND PHYSICAL      PRIMARY CARE PHYSICIAN: Dr. Garnette Gunner at [facility] in [place].    CHIEF COMPLAINT: Shortness of breath and chest pain.    SOURCE OF INFORMATION: The patient.    HISTORY OF PRESENT ILLNESS: The patient is a 70 year old lady with a  history of mechanical valve replacement in 2000, who presented to the  emergency department complaining of new onset of shortness of breath and  palpitations this evening.    The patient was fine earlier today and at around 6 p.m., developed a racing  heartbeat associated with chest pressure and difficulty breathing. She  called her daughter, who convince the patient to come to the hospital. The  symptoms lasted for a couple of hours and were relieved with Cardizem  received in the emergency department. She is chest pain-free now and denies  shortness of breath. She has been in her usual health recently. She does  have a chronic cough as she continues to smoke a pack of cigarettes per  day.    PAST MEDICAL HISTORY: Includes hypertension, COPD, coronary artery disease,  status post aortic valve replacement.    MEDICATIONS  1. Losartan 100 mg daily.  2. Lexapro 10 mg daily.  3. Metoprolol SR 200 mg daily.  4. Coumadin 1 mg daily alternating with 2 mg on Tuesdays and Thursdays.  5. Aldactone 25 mg daily.  6. Advair 250/50 mcg 1 inhalation twice a day.  7. Nexium, which she recently stopped because she cannot afford it and it  is not covered by her insurance.  8. Flonase p.r.n.    ALLERGIES: INCLUDE PENICILLIN.    SOCIAL HISTORY: The patient smokes. She lives at home.    FAMILY HISTORY: Noncontributory.    REVIEW OF SYSTEMS: The patient has lost some weight recently due to   diminishing appetite. Denies leg swelling, back pain, abdominal pain. She  does have occasional diarrhea. Denies rashes or joint pains. No history of  bleeding or blood clots.    PHYSICAL EXAMINATION  GENERAL: The patient is a late middle-aged lady in no acute distress.  VITAL SIGNS: Heart rate is 76 and regular, blood pressure is 102/62, oxygen  saturation 98% on room air. She weighs 131 pounds.  HEENT: Head is atraumatic. Pupils are round and reactive to light. There is  no sinus tenderness. Oral mucosa is moist.  NECK: Supple. There is no JVD.  LUNGS: Clear to auscultation bilaterally.  HEART: Rhythm is regular with a sharp valvular click.  ABDOMEN: Soft and nontender.  EXTREMITIES: Lower extremities have no edema or signs of DVT.  NEUROLOGIC: Nonfocal.    LABORATORY DATA: EKG shows atrial fibrillation with ST-T changes in lateral  leads.    Chest x-ray  shows no active disease.    CBC is within normal limits. INR is 3.1. Potassium is 3.2, creatinine 1.2.  Magnesium 1.7. LFTs normal. Initial troponin was 0.06, repeat an hour later  was 0.19. CK 141.    ASSESSMENT  1. Paroxysmal atrial fibrillation of new onset with spontaneous conversion  to sinus rhythm with treatment. Rule out underlying ischemia.  2. Chest pain and elevated troponin with nonspecific electrocardiogram  changes, possibly representing primary coronary ischemia versus demand  ischemia from rapid ventricular rate.  3. Mild hypokalemia.  4. Mild hyperglycemia, rule out diabetes.    PLAN: The patient is admitted to telemetry for further treatment and  evaluation. Will monitor her closely for recurrent atrial fibrillation. She  is in sinus rhythm now. Will continue her medications except for some of  her antihypertensive medications that will be held due to her borderline  low blood pressure. Use Cardizem and/or metoprolol as needed for recurrent  atrial fibrillation. Will administer aspirin now and consult Cardiology.   She has not seen a cardiologist in about 3 years. Will replete her  potassium. Check hemoglobin A1c. Additional pharmacologic DVT prophylaxis  is not necessary.    CODE STATUS: FULL CODE.        Reviewed on 12/31/2010 5:49 AM              Cheral Almas, MD    cc:   Cheral Almas, MD  DR. CHARLES RULA AT [facility] IN [place]    MG/wmx; D: 12/30/2010 11:45 P; T: 12/31/2010 12:23 A; DOC# [id number]; Job#  [id number]

## 2011-01-18 NOTE — Progress Notes (Signed)
Adeola Hawkins is a 70 y.o. female     [birth year]    Date of Visit- 01/18/2011  [facility]  [facility]- Division of [facility]  HPI:    Patient had had a mitral valve replacement in 2000 in [place] and got INR checks but had no further cardiac follow up and presented to [facility] recently with rapid atrial fibrillation with PVCs.  She went back into sinus rhythm and the troponin went up to 0.58. The patient returns today in follow up after hospital discharge, generally without symptoms. She says she is doing her normal activities without difficulty. Has no current shortness of breath. Her EKG today shows her to be in sinus rhythm with left atrial enlargement and nonspecific ST-T wave findings. She was maintained on Coumadin for stroke prevention and she has rhythm control attempted with Toprol XL at 200 mg daily. She sees Dr. Nash Mantis who follows her Coumadin levels.       Impression: Mainly I am concerned about her blood pressure levels. I would like her to keep a blood pressure diary and at least twice a week.  Question is whether the elevated troponin represented ischemic heart disease. The patient is having no current chest pain or chest pressure.  An echocardiogram will be reassessed for the valve.    Orders Placed This Encounter   ??? AMB POC EKG ROUTINE W/ 12 LEADS, INTER & REP   ??? 2D ECHO COMPLETE ADULT (TTE)    I also think we should consider some sort of stress testing since the troponin was elevated, although the patient is pain free but there was a positive troponin and I think this could be explained possibly by the atrial fibrillation and supply/demand mismatch. I am concerned this may represent underlying coronary disease. A high risk area would change our management.  Future Appointments  Date Time Provider Department Center   04/18/2011 11:00 AM Reynolds Echotwo [facility] SCHED    04/18/2011 11:40 AM David Towson Gasper Lloyd, MD [facility] SCHED        ICD-9-CM    1. Atrial fibrillation 427.31 AMB POC EKG ROUTINE W/ 12 LEADS, INTER & REP   2. HTN (hypertension) 401.9    3. S/P MVR (mitral valve replacement) V43.3 2D ECHO COMPLETE ADULT (TTE)   4. 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 Coronary atherosclerosis of native coronary artery 414.01           Past Medical History   Diagnosis Date   ??? Hypertension    ??? COPD    ??? CAD (coronary artery disease)    ??? HTN (hypertension)    ??? S/P MVR (mitral valve replacement)          reports that she has been smoking.  She does not have any smokeless tobacco history on file. She reports that she drinks alcohol.   Allergies, Social Hx and Medical-Surgical Hx have been reviewed as pertinent in EMR. ; ROS-pertinents  negative except as above     Current Outpatient Prescriptions   Medication Sig   ??? metoprolol-XL (TOPROL-XL) 200 mg XL tablet Take 200 mg by mouth daily.     ??? spironolactone (ALDACTONE) 25 mg tablet Take  by mouth daily.     ??? fluticasone-salmeterol (ADVAIR DISKUS) 250-50 mcg/dose diskus inhaler Take 1 Puff by inhalation every twelve (12) hours.     ??? escitalopram (LEXAPRO) 10 mg tablet Take 10 mg  by mouth daily.     ??? warfarin (COUMADIN) 1 mg tablet Take 1 mg by mouth daily. 2 mg Tue and Thur, alternating with 1 mg daily        Exam and Labs:  BP 138/86   Pulse 76   Ht 5\' 6"  (1.676 m)   Wt 134 lb (60.782 kg)   BMI 21.63 kg/m2  Constitutional:  NAD, comfortable ,   HENT: Head: NC,ATEyes: No scleral icterus. Neck:  Neck supple. No JVD present. No tracheal deviation,mass  Chest: Effort normal & normal respiratory excursion     Lungs:breath sounds normal. No stridor. distress, wheezes or  Rales.  Heart:normal rate, regular rhythm, normal S1, S2, no murmurs, rubs, clicks or gallops , PMI non displaced.    Edema: Edema is none.    Extremities:  no clubbing or cyanosis.  Abdominal:  no abnormal distension.    Neurological: alert, conversant and oriented . Skin: Skin is not cold. No obvious systemic rash noted. Not diaphoretic. No erythema. Psychiatric:  Grossly normal mood and affect.  Behavior appears normal.        Lab Results   Component Value Date/Time    Sodium 138 12/30/2010  8:45 PM    Potassium 3.2 12/30/2010  8:45 PM    Chloride 101 12/30/2010  8:45 PM    CO2 25 12/30/2010  8:45 PM    Anion gap 12 12/30/2010  8:45 PM    Glucose 128 12/30/2010  8:45 PM    BUN 11 12/30/2010  8:45 PM    Creatinine 1.2 12/30/2010  8:45 PM    BUN/Creatinine ratio 9 12/30/2010  8:45 PM    GFR est non-AA 47 12/30/2010  8:45 PM    Calcium 9.1 12/30/2010  8:45 PM    GFR est AA 57 12/30/2010  8:45 PM      Wt Readings from Last 3 Encounters:   01/18/11 134 lb (60.782 kg)   12/31/10 132 lb 11.5 oz (60.2 kg)   08/27/10 139 lb (63.05 kg)      BP Readings from Last 3 Encounters:   01/18/11 138/86   12/31/10 121/52   08/27/10 100/70      Impression see above.

## 2011-01-18 NOTE — Patient Instructions (Signed)
Follow up in 3 months with echocardiogram, keep a blood pressure diary weekly if b/p is greater than 140 call the office

## 2011-04-05 LAB — METABOLIC PANEL, COMPREHENSIVE
A-G Ratio: 0.8 — ABNORMAL LOW (ref 1.1–2.2)
ALT (SGPT): 16 U/L (ref 12–78)
AST (SGOT): 18 U/L (ref 15–37)
Albumin: 3.8 g/dL (ref 3.5–5.0)
Alk. phosphatase: 83 U/L (ref 50–136)
Anion gap: 6 mmol/L (ref 5–15)
BUN/Creatinine ratio: 16 (ref 12–20)
BUN: 12 MG/DL (ref 6–20)
Bilirubin, total: 0.5 MG/DL (ref 0.2–1.0)
CO2: 27 MMOL/L (ref 21–32)
Calcium: 8.7 MG/DL (ref 8.5–10.1)
Chloride: 105 MMOL/L (ref 97–108)
Creatinine: 0.77 MG/DL (ref 0.45–1.15)
GFR est AA: >60 mL/min/{1.73_m2} (ref >60)
GFR est non-AA: >60 mL/min/{1.73_m2} (ref >60)
Globulin: 4.7 g/dL — ABNORMAL HIGH (ref 2.0–4.0)
Glucose: 99 MG/DL (ref 65–100)
Potassium: 3.4 MMOL/L — ABNORMAL LOW (ref 3.5–5.1)
Protein, total: 8.5 g/dL — ABNORMAL HIGH (ref 6.4–8.2)
Sodium: 138 MMOL/L (ref 136–145)

## 2011-04-05 LAB — PTT: aPTT: 38.6 s — ABNORMAL HIGH (ref 24.0–31.5)

## 2011-04-05 LAB — CBC WITH AUTOMATED DIFF
ABS. BASOPHILS: 0 10*3/uL (ref 0.0–0.1)
ABS. EOSINOPHILS: 0 10*3/uL (ref 0.0–0.4)
ABS. LYMPHOCYTES: 1.5 10*3/uL (ref 0.8–3.5)
ABS. MONOCYTES: 0.3 10*3/uL (ref 0.0–1.0)
ABS. NEUTROPHILS: 3.5 10*3/uL (ref 1.8–8.0)
BASOPHILS: 0 % (ref 0–1)
EOSINOPHILS: 1 % (ref 0–7)
HCT: 37.3 % (ref 35.0–47.0)
HGB: 12.3 g/dL (ref 11.5–16.0)
LYMPHOCYTES: 28 % (ref 12–49)
MCH: 27.4 PG (ref 26.0–34.0)
MCHC: 33 g/dL (ref 30.0–36.5)
MCV: 83.1 FL (ref 80.0–99.0)
MONOCYTES: 6 % (ref 5–13)
NEUTROPHILS: 65 % (ref 32–75)
PLATELET: 234 10*3/uL (ref 150–400)
RBC: 4.49 M/uL (ref 3.80–5.20)
RDW: 15.9 % — ABNORMAL HIGH (ref 11.5–14.5)
WBC: 5.3 10*3/uL (ref 3.6–11.0)

## 2011-04-05 LAB — PROTHROMBIN TIME + INR
INR: 1.5 — ABNORMAL HIGH (ref 0.9–1.1)
Prothrombin time: 16.3 s — ABNORMAL HIGH (ref 9.4–11.7)

## 2011-04-05 LAB — POC TROPONIN-I
Troponin-I (POC): <0.04 ng/mL (ref 0.00–0.08)
Troponin-I (POC): <0.04 ng/mL (ref 0.00–0.08)

## 2011-04-05 LAB — ETHYL ALCOHOL: ALCOHOL(ETHYL),SERUM: <10 MG/DL (ref <10)

## 2011-04-05 LAB — EKG, 12 LEAD, INITIAL
Atrial Rate: 102 {beats}/min
Calculated P Axis: 57 degrees
Calculated R Axis: -12 degrees
Calculated T Axis: 74 degrees
P-R Interval: 152 ms
Q-T Interval: 368 ms
QRS Duration: 76 ms
QTC Calculation (Bezet): 479 ms
Ventricular Rate: 102 {beats}/min

## 2011-04-05 LAB — CK W/ CKMB & INDEX
CK - MB: 0.8 NG/ML (ref 0.5–3.6)
CK-MB Index: 1 (ref 0–2.5)
CK: 82 U/L (ref 26–192)

## 2011-04-05 LAB — MAGNESIUM: Magnesium: 1.8 MG/DL (ref 1.6–2.4)

## 2011-04-05 MED ADMIN — nitroglycerin (NITROSTAT) tablet 0.4 mg: SUBLINGUAL | @ 07:00:00 | NDC 00071041813

## 2011-04-05 MED ADMIN — aspirin chewable tablet 162 mg: ORAL | @ 07:00:00 | NDC 63739043401

## 2011-04-05 MED ADMIN — acetaminophen (TYLENOL) tablet 650 mg: ORAL | @ 08:00:00 | NDC 00904198280

## 2011-04-05 NOTE — ED Notes (Signed)
Fast heart rate for about 10 min prior to arrival.  Co left arm pain, dizziness and chest pressure

## 2011-04-05 NOTE — ED Notes (Signed)
Patient given all discharge paperwork by MD and verbalizes understanding. Patient seen ambulating out of ED with steady gait in no apparent distress.

## 2011-04-05 NOTE — ED Provider Notes (Signed)
HPI Comments: This is a 70 y.o.female with a past medical hx of HTN, COPD, coronary atherosclerosis (native coronary artery), mitral valve replacement and subendocardial infraction presents to the ED with a complaint of constant left sided chest pressure and palpitation which began few hours ago while she was watching TV associated with pain in left arm and neck. Pt states that pain is mildly worse with exertion. She also notes having heartburn earlier tonight. Pt states that she did not take any medications prior to coming to the ED. Pt reports taking coumadin daily. Pt denies recent stress test and cardiac catherization. Pt also denies hx of stent placement. Pt denies SOB, nausea and diaphoresis. Pt's cardiologist is Dr. Gasper Lloyd.     Social hx: current everyday smoker, drinks alcohol.   Note written by Romie Levee, Scribe, as dictated by Levada Schilling, MD 2:03 AM          Patient is a 70 y.o. female presenting with palpitations. The history is provided by the patient.   Palpitations   Pertinent negatives include no fever, no chest pain, no abdominal pain, no nausea, no vomiting, no headaches, no back pain, no cough and no shortness of breath.        Past Medical History   Diagnosis Date   ??? Hypertension    ??? COPD    ??? CAD (coronary artery disease)    ??? HTN (hypertension)    ??? S/P MVR (mitral valve replacement)    ??? Coronary atherosclerosis of native coronary artery 01/24/2011   ??? Subendocardial infarction, subsequent episode of care 01/24/2011        Past Surgical History   Procedure Date   ??? Valve replacement only each    ??? Hx aortic valve replacement    ??? Hx tubal ligation          No family history on file.     History     Social History   ??? Marital Status: Single     Spouse Name: N/A     Number of Children: N/A   ??? Years of Education: N/A     Occupational History   ??? Not on file.     Social History Main Topics   ??? Smoking status: Current Everyday Smoker -- 1.0 packs/day   ??? Smokeless tobacco: Not on file    ??? Alcohol Use: Yes   ??? Drug Use:    ??? Sexually Active:      Other Topics Concern   ??? Not on file     Social History Narrative   ??? No narrative on file                  ALLERGIES: Penicillins      Review of Systems   Constitutional: Negative for fever.   HENT: Positive for neck pain. Negative for neck stiffness.    Eyes: Negative for visual disturbance.   Respiratory: Negative for cough, shortness of breath and wheezing.    Cardiovascular: Positive for palpitations. Negative for chest pain and leg swelling.   Gastrointestinal: Negative for nausea, vomiting, abdominal pain and diarrhea.   Genitourinary: Negative for dysuria.   Musculoskeletal: Negative for back pain.        Left arm pain    Skin: Negative for rash.   Neurological: Negative.  Negative for syncope and headaches.   Hematological: Negative.    Psychiatric/Behavioral: Negative for confusion.   All other systems reviewed and are negative.  Filed Vitals:    04/05/11 0146   BP: 150/90   Pulse: 94   Temp: 98.2 ??F (36.8 ??C)   Resp: 20   Height: 5' 6.75" (1.695 m)   Weight: 62.596 kg (138 lb)   SpO2: 98%            Physical Exam   Nursing note and vitals reviewed.  Constitutional: She is oriented to person, place, and time. She appears well-developed and well-nourished. No distress.        Appears uncomfortable   HENT:   Head: Normocephalic.   Eyes: Pupils are equal, round, and reactive to light.   Neck: Normal range of motion.   Cardiovascular: Normal rate and regular rhythm.    No murmur heard.  Pulmonary/Chest: Effort normal and breath sounds normal. No respiratory distress.   Abdominal: Soft. There is no tenderness.   Musculoskeletal: Normal range of motion. She exhibits no edema.   Neurological: She is alert and oriented to person, place, and time.   Skin: Skin is warm and dry.   Psychiatric: She has a normal mood and affect. Her behavior is normal.        MDM    Procedures    ED EKG interpretation:   Rhythm: sinus tachycardia; and regular . Rate (approx.): 105; Axis: normal; P wave: normal; QRS interval: normal ; ST/T wave: non-specific changesl; occasional PVCs. This EKG was interpreted by Doy Mince. Meade Maw, MD,ED Provider.    A/P:  1.  Chest pain - enzymes neg x2.  Needs OP stress test.   2.  Subtherapeutic INR- advised to follow-up with PCP.    Patient's results have been reviewed with them.  Patient and/or family have verbally conveyed their understanding and agreement of the patient's signs, symptoms, diagnosis, treatment and prognosis and additionally agree to follow up as recommended or return to the Emergency Room should their condition change prior to follow-up.  Discharge instructions have also been provided to the patient with some educational information regarding their diagnosis as well a list of reasons why they would want to return to the ER prior to their follow-up appointment should their condition change.

## 2011-04-11 NOTE — Telephone Encounter (Signed)
done

## 2011-04-11 NOTE — Telephone Encounter (Signed)
Alyssa Hawkins from [facility] hosp coumadin clinic called need last office note,any cardiac testing, faxed to [phone]. Office # is [phone]

## 2011-05-20 LAB — AMB POC PT/INR: INR POC: 3.9

## 2011-05-20 NOTE — Progress Notes (Signed)
See INR results

## 2011-05-20 NOTE — Progress Notes (Signed)
Echo completed. Ordered by and read by: Dr.Sabharwal

## 2011-05-20 NOTE — Progress Notes (Signed)
Alyssa Hawkins     Nov 07, 1940      Date of Visit- 05/20/2011   [facility]  [facility]- Division of [facility]  Delcia Spitzley K. Hasani Diemer MD, [facility]  HPI:  Alyssa Hawkins is a 71 y.o. female   Alyssa Hawkins returns today accompanied by her daughter. She had an episode in August with troponin to 0.58 and the echo at that time at the hospital had been a limited quality echo but had showed preserved  LV function.  She return today and she think she had a mitral valve done but it looks like to me that she has a prosthetic metal aortic valve and then probably had some sort of repair or process at the mitral valve position.  It is unclear if she really had any coronary disease in the past but with the troponin elevated we think she probably has some disease. She had one episode of chest pain and went to the emergency room recently and was sent home from there.  They report no other episodes of chest pain.  She has fatigued, tiredness, shortness of breath and some mild edema.    Impression:  Patient seems to have an aortic valve replacement, mitral valve repair by today's echo. The LV function is good. She may well have coronary disease. Having chest pain, very stoic.  Difficult historian, records reviewed  at the hospital due to her history giving willingness.  I would get a stress nuclear, which she is totally comfortable with and we will follow up those results with her.  Exam shows 2/6 systolic murmur with an audible click heard.             1. Mitral and aortic valve disease    2. Coronary atherosclerosis of native coronary artery    3. Atrial fibrillation    4. HTN (hypertension)    5. Subendocardial infarction, subsequent episode of care       Past Medical History   Diagnosis Date   ??? Hypertension    ??? COPD    ??? CAD (coronary artery disease)    ??? HTN (hypertension)    ??? S/P MVR (mitral valve replacement)    ??? Coronary atherosclerosis of native  coronary artery 01/24/2011   ??? Subendocardial infarction, subsequent episode of care 01/24/2011     Social Hx=  reports that she has been smoking.  She does not have any smokeless tobacco history on file. She reports that she drinks alcohol.     Allergies, Social Hx, Family  and Medical-Surgical Hx have been reviewed as pertinent in EMR. ;   ROS-complete cardiac and respiratory Reviewed and negative except as above ,and denies wheezing  orproductive cough, No unusual weight loss, recent seizure, BRBPR or hematuria except as noted above.    Current outpatient prescriptions:aspirin 81 mg chewable tablet, Take 81 mg by mouth daily.  , Disp: , Rfl: ;  metoprolol-XL (TOPROL-XL) 200 mg XL tablet, Take 200 mg by mouth daily.  , Disp: , Rfl: ;  spironolactone (ALDACTONE) 25 mg tablet, Take  by mouth daily.  , Disp: , Rfl: ;  fluticasone-salmeterol (ADVAIR DISKUS) 250-50 mcg/dose diskus inhaler, Take 1 Puff by inhalation every twelve (12) hours.  , Disp: , Rfl:   escitalopram (LEXAPRO) 10 mg tablet, Take 10 mg by mouth daily.  , Disp: , Rfl: ;  warfarin (COUMADIN) 1 mg tablet, Take 1 mg by mouth daily. 2 mg Tue and Thur,  alternating with 1 mg daily, Disp: , Rfl:     Exam and Labs:  BP 120/70   Ht 5\' 6"  (1.676 m)   Wt 133 lb 3.2 oz (60.419 kg)   BMI 21.50 kg/m2  Constitutional:  NAD, comfortable ,   HENT: Head: NC,ATEyes: No scleral icterus. Neck:  Neck supple. No JVD present. No tracheal deviation,mass  Chest: Effort normal & normal respiratory excursion     Lungs:breath sounds normal. No stridor. distress, wheezes or  Rales.  Heart:normal rate, regular rhythm, normal S1, S2, no \\, rubs,or gallops , PMI non displaced. Exam shows 2/6 systolic murmur with an audible click heard.    Edema: Edema is none.    Extremities:  no clubbing or cyanosis.  Abdominal:  no abnormal distension.   Neurological: alert, conversant and oriented . Skin: Skin is not cold. No obvious systemic rash noted. Not diaphoretic. No erythema. Psychiatric:   Grossly normal mood and affect.  Behavior appears normal.           Wt Readings from Last 3 Encounters:   05/20/11 133 lb 3.2 oz (60.419 kg)   04/05/11 138 lb (62.596 kg)   01/18/11 134 lb (60.782 kg)      BP Readings from Last 3 Encounters:   05/20/11 120/70   04/05/11 137/66   01/18/11 138/86      Impression see above.

## 2011-05-24 MED ORDER — NUCLEAR MEDICINE ISOTOPE
Freq: Once | Status: AC
Start: 2011-05-24 — End: 2011-05-24

## 2011-05-24 NOTE — Progress Notes (Signed)
See scanned document.      Ordering Doctor:Dr. Sabharwal  Reading Doctor:Dr. Sabharwal

## 2011-05-24 NOTE — Telephone Encounter (Signed)
Stress card. done today

## 2011-05-27 NOTE — Telephone Encounter (Signed)
Nuclear is low risk with no ischemia and normal EF  Left message for pt to call at her home and left message that test was normal on cell since she identified on cell phone VM  Nurse to call pt for test result on Monday to confirm  Keep appt in July and come back sooner if cp

## 2011-06-01 NOTE — Telephone Encounter (Signed)
Alyssa Hawkins [facility] called to get the PT INR results faxed to her from pt. You can fax the back to [phone] office#[phone]

## 2011-06-07 NOTE — Telephone Encounter (Signed)
Pt coumadin results faxed to [facility].

## 2011-09-14 ENCOUNTER — Inpatient Hospital Stay
Admit: 2011-09-14 | Discharge: 2011-09-16 | Disposition: A | Discharge status: Home / Self Care | Payer: MEDICARE | Attending: Internal Medicine | Admitting: Internal Medicine

## 2011-09-14 DIAGNOSIS — G40209 Localization-related (focal) (partial) symptomatic epilepsy and epileptic syndromes with complex partial seizures, not intractable, without status epilepticus: Secondary | ICD-10-CM

## 2011-09-14 LAB — URINALYSIS W/ REFLEX CULTURE
Bacteria: NEGATIVE /HPF
Bilirubin: NEGATIVE
Glucose: NEGATIVE MG/DL
Ketone: NEGATIVE MG/DL
Leukocyte Esterase: NEGATIVE
Nitrites: NEGATIVE
Protein: 100 MG/DL — AB
Specific gravity: 1.018 (ref 1.003–1.030)
Urobilinogen: 0.2 EU/DL (ref 0.2–1.0)
pH (UA): 6.5 (ref 5.0–8.0)

## 2011-09-14 LAB — METABOLIC PANEL, COMPREHENSIVE
A-G Ratio: 0.8 — ABNORMAL LOW (ref 1.1–2.2)
ALT (SGPT): 26 U/L (ref 12–78)
AST (SGOT): 38 U/L — ABNORMAL HIGH (ref 15–37)
Albumin: 3.9 g/dL (ref 3.5–5.0)
Alk. phosphatase: 92 U/L (ref 50–136)
Anion gap: 6 mmol/L (ref 5–15)
BUN/Creatinine ratio: 19 (ref 12–20)
BUN: 13 MG/DL (ref 6–20)
Bilirubin, total: 0.5 MG/DL (ref 0.2–1.0)
CO2: 25 MMOL/L (ref 21–32)
Calcium: 9.6 MG/DL (ref 8.5–10.1)
Chloride: 107 MMOL/L (ref 97–108)
Creatinine: 0.7 MG/DL (ref 0.45–1.15)
GFR est AA: >60 mL/min/{1.73_m2} (ref >60)
GFR est non-AA: >60 mL/min/{1.73_m2} (ref >60)
Globulin: 5.2 g/dL — ABNORMAL HIGH (ref 2.0–4.0)
Glucose: 129 MG/DL — ABNORMAL HIGH (ref 65–100)
Potassium: 4.6 MMOL/L (ref 3.5–5.1)
Protein, total: 9.1 g/dL — ABNORMAL HIGH (ref 6.4–8.2)
Sodium: 138 MMOL/L (ref 136–145)

## 2011-09-14 LAB — EKG, 12 LEAD, INITIAL
Atrial Rate: 87 {beats}/min
Calculated P Axis: 60 degrees
Calculated R Axis: -23 degrees
Calculated T Axis: 80 degrees
Diagnosis: NORMAL
P-R Interval: 140 ms
Q-T Interval: 390 ms
QRS Duration: 72 ms
QTC Calculation (Bezet): 469 ms
Ventricular Rate: 87 {beats}/min

## 2011-09-14 LAB — CBC WITH AUTOMATED DIFF
ABS. BASOPHILS: 0 10*3/uL (ref 0.0–0.1)
ABS. EOSINOPHILS: 0 10*3/uL (ref 0.0–0.4)
ABS. LYMPHOCYTES: 0.8 10*3/uL (ref 0.8–3.5)
ABS. MONOCYTES: 0.4 10*3/uL (ref 0.0–1.0)
ABS. NEUTROPHILS: 3.8 10*3/uL (ref 1.8–8.0)
BASOPHILS: 0 % (ref 0–1)
EOSINOPHILS: 0 % (ref 0–7)
HCT: 43.4 % (ref 35.0–47.0)
HGB: 14.5 g/dL (ref 11.5–16.0)
LYMPHOCYTES: 16 % (ref 12–49)
MCH: 27.9 PG (ref 26.0–34.0)
MCHC: 33.4 g/dL (ref 30.0–36.5)
MCV: 83.6 FL (ref 80.0–99.0)
MONOCYTES: 8 % (ref 5–13)
NEUTROPHILS: 76 % — ABNORMAL HIGH (ref 32–75)
PLATELET: 235 10*3/uL (ref 150–400)
RBC: 5.19 M/uL (ref 3.80–5.20)
RDW: 16.8 % — ABNORMAL HIGH (ref 11.5–14.5)
WBC: 5.1 10*3/uL (ref 3.6–11.0)

## 2011-09-14 LAB — PROTHROMBIN TIME + INR
INR: 2.1 — ABNORMAL HIGH (ref 0.9–1.1)
Prothrombin time: 21.5 s — ABNORMAL HIGH (ref 9.4–11.7)

## 2011-09-14 LAB — CK W/ CKMB & INDEX
CK - MB: 1 NG/ML (ref 0.5–3.6)
CK-MB Index: 0.6 (ref 0–2.5)
CK: 168 U/L (ref 26–192)

## 2011-09-14 LAB — ETHYL ALCOHOL: ALCOHOL(ETHYL),SERUM: <10 MG/DL (ref <10)

## 2011-09-14 LAB — TROPONIN I: Troponin-I, Qt.: <0.04 ng/mL (ref <0.05)

## 2011-09-14 MED ADMIN — warfarin (COUMADIN) tablet 2 mg: ORAL | @ 22:00:00 | NDC 00056017001

## 2011-09-14 MED FILL — COUMADIN 2 MG TABLET: 2 mg | ORAL | Qty: 1

## 2011-09-14 MED FILL — PHARMACY INFORMATION NOTE: Qty: 1

## 2011-09-14 NOTE — Progress Notes (Signed)
Physical Therapy Screening:  A referral to physical therapy order is indicated when the patient is medically stable.    A screening was performed on this patient's chart based on their entrance into the emergency department and potential need for physical therapy indentified.  The patient???s chart was reviewed and the patient interviewed/screened and found to be appropriate for a skilled therapy evaluation.  Please consult for physical therapy if you would like an evaluation to be completed.  Thank you.

## 2011-09-14 NOTE — ED Notes (Signed)
Pt comes to ER from home after daughter found pt on the floor and was confused.  Pt has history of seizures but is no longer on any medication for them.  Pt is alert and oriented at present time pt states that she is feeling "groggy".

## 2011-09-14 NOTE — Progress Notes (Signed)
55-   Verbal report received from [facility]) on [employer]  being received from ED(unit) for routine progression of care      Report consisted of patient???s Situation, Background, Assessment and   Recommendations(SBAR).     Information from the following report(s) SBAR, Kardex, Procedure Summary, Intake/Output, MAR and Recent Results was reviewed with the receiving nurse.    Opportunity for questions and clarification was provided.      Assessment completed upon patient???s arrival to unit and care assumed.     1930- Bedside shift change report given to [facility] (oncoming nurse) by Shade Flood rn (offgoing nurse).  Report given with SBAR, Kardex, ED Summary, Procedure Summary, MAR and Recent Results.

## 2011-09-14 NOTE — Progress Notes (Signed)
Admission Medication Reconciliation:    Information obtained from: Medication bottles and [facility]    Significant PMH/Disease States:   Past Medical History   Diagnosis Date   ??? Hypertension    ??? COPD    ??? CAD (coronary artery disease)    ??? HTN (hypertension)    ??? S/P MVR (mitral valve replacement)    ??? Coronary atherosclerosis of native coronary artery 01/24/2011   ??? Subendocardial infarction, subsequent episode of care 01/24/2011       Chief Complaint for this Admission:  Confusion    Allergies:  Clindamycin and Penicillins    Prior to Admission Medications:   Prior to Admission Medications   Medication Last Dose Informant Patient Reported? Taking?   metoprolol-XL (TOPROL-XL) 200 mg XL tablet 09/13/2011 at Unknown  Yes Yes   Take 200 mg by mouth daily.     escitalopram (LEXAPRO) 10 mg tablet 09/13/2011 at Unknown  Yes Yes   Take 10 mg by mouth daily.     spironolactone (ALDACTONE) 25 mg tablet 09/13/2011  Yes Yes   Take 25 mg by mouth daily.   famotidine (PEPCID) 20 mg tablet   Yes Yes   Take 20 mg by mouth two (2) times daily as needed. Indications: indigestion   warfarin (COUMADIN) 2 mg tablet 09/12/2011  Yes Yes   Take 0.5 mg by mouth five (5) days a week. Family reports that patient takes 0.5 mg every day except for Tu/Thurs.  Patient has a 2 mg tablet at home, but patient could not confirm that she is cutting this pill in quarters.  Family is positive that the dose is 0.5 mg   warfarin (COUMADIN) 2 mg tablet 09/13/2011  Yes Yes   Take 1 mg by mouth every Tuesday and Thursday.   fluticasone-salmeterol (ADVAIR DISKUS) 250-50 mcg/dose diskus inhaler Not Taking at Unknown  Yes No   Take 1 Puff by inhalation every twelve (12) hours.            Comments/Recommendations: Updated patient's PTA medication list.  Patient recently had a reaction to Clindamycin and at that time the patient was placed on Famotidine 20 mg BID x 7 days.  The patient did not complete this medication and is now using the remaining pills as  needed for indigestion.  The patient's family members report that she is taking Warfarin 1 mg on Tues/Thurs and 0.5 mg every other day.  However, [facility] only [facility] 2 mg tablet [facility] the patient and the family members were unsure if the patient was cutting this medication into quarters.  The patient is unable to confirm how she takes the medication.  Family members are sure that the dose is 1 mg and 0.5 mg.  Family also reports that patient uses Nasonex, but this medication has never been filled at [facility] (or at least not in the last 2 years as it is not showing up in their system).

## 2011-09-14 NOTE — ED Provider Notes (Signed)
HPI Comments: 71 y.o.female with history of HTN, COPD, CAD, seizures, and MVR who presents to the ED with complaints of confusion. Daughter left patient asleep at home around 1100 and returned at 1400 when she found patient confused on the floor and called 911. Patient unsure of incident and states "i fainted or something." States her head feels groggy now. States it is mid-June 1913. Last seizure was "years ago" and patient no longer takes any seizure medications. Denies any HA, fever, chills, cough, chest pain, dyspnea, diaphoresis, abdominal pain, nausea, vomiting, numbness, weakness, blurred vision, or speech difficulty.     Surgical Hx: aortic valve replacement, tubal ligation  Social Hx: current smoker, +alcohol use    Note written by Shara Blazing, Scribe, as dictated by Volanda Napoleon, DO 3:28 PM      Full history, physical exam, and ROS unable to be obtained due to:  confusion.      The history is provided by the patient.        Past Medical History   Diagnosis Date   ??? Hypertension    ??? COPD    ??? CAD (coronary artery disease)    ??? HTN (hypertension)    ??? S/P MVR (mitral valve replacement)    ??? Coronary atherosclerosis of native coronary artery 01/24/2011   ??? Subendocardial infarction, subsequent episode of care 01/24/2011        Past Surgical History   Procedure Date   ??? Valve replacement only each    ??? Hx aortic valve replacement    ??? Hx tubal ligation          History reviewed. No pertinent family history.     History     Social History   ??? Marital Status: SINGLE     Spouse Name: N/A     Number of Children: N/A   ??? Years of Education: N/A     Occupational History   ??? Not on file.     Social History Main Topics   ??? Smoking status: Current Everyday Smoker -- 1.0 packs/day   ??? Smokeless tobacco: Not on file   ??? Alcohol Use: Yes   ??? Drug Use:    ??? Sexually Active:      Other Topics Concern   ??? Not on file     Social History Narrative   ??? No narrative on file                  ALLERGIES: Penicillins       Review of Systems   Constitutional: Negative for fever, chills and diaphoresis.   HENT: Negative for neck stiffness.    Eyes: Negative for visual disturbance.   Respiratory: Negative for cough, shortness of breath and wheezing.    Cardiovascular: Negative for chest pain and leg swelling.   Gastrointestinal: Negative for nausea, vomiting, abdominal pain and diarrhea.   Genitourinary: Negative for dysuria, frequency, hematuria and flank pain.   Musculoskeletal: Negative.  Negative for back pain.   Skin: Negative for rash.   Neurological: Negative.  Negative for weakness, numbness and headaches.   Hematological: Negative.    Psychiatric/Behavioral: Positive for confusion. Negative for hallucinations.   All other systems reviewed and are negative.        Filed Vitals:    09/14/11 1509   BP: 146/85   Pulse: 100   Temp: 99 ??F (37.2 ??C)   Resp: 18   Height: 5\' 6"  (1.676 m)   Weight: 58.968 kg (130  lb)   SpO2: 95%            Physical Exam   Nursing note and vitals reviewed.  Constitutional: She appears well-developed and well-nourished. No distress.   HENT:   Head: Normocephalic and atraumatic.   Nose: Nose normal.   Neck: Normal range of motion. Neck supple. No JVD present.   Cardiovascular: Normal rate and regular rhythm.  Exam reveals no friction rub.         Valve click heard   Pulmonary/Chest: Effort normal. No respiratory distress.   Abdominal: Soft. Bowel sounds are normal. There is no tenderness. There is no rebound and no guarding.   Musculoskeletal: Normal range of motion. She exhibits no edema.   Neurological: She is alert.        Oriented to self, place   Skin: Skin is warm and dry. No rash noted.   Psychiatric: She has a normal mood and affect. Her behavior is normal. Thought content normal.        MDM    Procedures    CONSULT NOTE:  Volanda Napoleon, DO spoke to Dr. Welton Flakes, hospitalist, concerning the patient. The patient's history, presentation, physical findings, and results were all discussed. He/she has  agreed to admit the patient  Note written by Shara Blazing, Scribe, as dictated by Volanda Napoleon, DO 4:45 PM      Labs Reviewed   CBC WITH AUTOMATED DIFF - Abnormal; Notable for the following:     RDW 16.8 (*)      NEUTROPHILS 76 (*)      All other components within normal limits   METABOLIC PANEL, COMPREHENSIVE - Abnormal; Notable for the following:     Glucose 129 (*)      AST 38 (*)  SPECIMEN HEMOLYZED, RESULTS MAY BE AFFECTED    Protein, total 9.1 (*)      Globulin 5.2 (*)      A-G Ratio 0.8 (*)      All other components within normal limits   PROTHROMBIN TIME - Abnormal; Notable for the following:     INR 2.1 (*)      Prothrombin time 21.5 (*)      All other components within normal limits   URINALYSIS W/ REFLEX CULTURE - Abnormal; Notable for the following:     Protein 100 (*)      Blood TRACE (*)      All other components within normal limits   TROPONIN I   ETHYL ALCOHOL   CK W/ CKMB & INDEX   SAMPLES BEING HELD   DRUG SCREEN, URINE   TSH, 3RD GENERATION   DRUG SCREEN, URINE       ED EKG interpretation:  Rhythm: normal sinus rhythm; and regular . Rate (approx.): 87; Axis: normal; P wave: normal; QRS interval: normal ; ST/T wave: normal; This EKG was interpreted by Volanda Napoleon, DO,ED Provider.    Likely had a seizure - still confused - will admit

## 2011-09-14 NOTE — H&P (Signed)
Name:       Alyssa Hawkins, Alyssa Hawkins          Admitted:    09/14/2011    Account #:  [id number]                     DOB:         11/10/1940  Physician:  Jaci Lazier, MD                Age:         71                               HISTORY AND PHYSICAL      PRIMARY CARE PHYSICIAN: Dr. Garnette Gunner at [facility] in [place].    PRESENTING COMPLAINT: Altered mental status.    HISTORY OF PRESENT ILLNESS: The patient is a 71 year old American female  who has previous history significant for atrial fibrillation, hypertension,  COPD, coronary artery disease, status post aortic valve replacement, was  brought in by her daughter because of altered mental status. According to  her daughter, who lives with her, the patient usually is up all night  playing video games and smoking cigarettes. She sleeps early in the  morning. This morning when the patient's daughter left, the patient was  sleeping, when she came back, she found her mother on the floor. It is not  clear if she had any seizure-like activity. The patient was disoriented initially. It  is of note that she had history of seizures, last seizure was 13 years ago.  The patient does admit to using marijuana and smoking a pack of cigarettes  every day. At this time she is completely alert and oriented. The patient  was not incontinent of urine. She denies having any chest pain, shortness  of breath, nausea, vomiting, fever or chills.    PAST MEDICAL HISTORY: Significant for:    1. Atrial fibrillation.  2. History of hypertension.  3. COPD.  4. Coronary artery disease.  5. Aortic valve replacement.    SOCIOECONOMIC HISTORY: The patient lives with her daughter. She smokes 1  pack per day. She uses marijuana. She does not use any intravenous drugs.  She drinks socially.    REVIEW OF SYSTEMS: Negative except as mentioned in history of present  illness. All systems were reviewed, no other positive finding was noticed.    MEDICATIONS PRIOR TO ADMISSION: Include:  1.  Lexapro 10 mg daily.  2. Pepcid 20 mg b.i.d.  3. Advair Diskus 250/50 q.12h.  4. Metoprolol XL 200 mg daily.  5. Aldactone 25 mg daily.  6. Coumadin.    FAMILY HISTORY: Noncontributory.    PHYSICAL EXAMINATION  GENERAL: The patient is alert, oriented, not in any acute distress, resting  comfortably.  VITAL SIGNS: Reveal temperature of 99, blood pressure 146/85, pulse 100,  respiratory rate is 18.  HEENT: Examination reveals pupils equally reacting to light and  accommodation.  NECK: Supple. There is no lymphadenopathy or JVD.  CHEST: Clear.  CARDIOVASCULAR: S1 and S2 regular. No murmur. No S3.  ABDOMEN: Does not reveal any organomegaly. No guarding, no rigidity. Bowel  sounds are active.  EXTREMITIES: No pedal edema. Good peripheral pulses. No cyanosis or  clubbing.  CNS: Nonfocal.    LABORATORY DATA: Her lab work revealed a white count of 5.1, hemoglobin  14.5, hematocrit 43.4, MCV is 83.6, platelet count is 235,000.  Her  chemistries revealed sodium 138, potassium 4.6, chloride 107, bicarbonate  25, gap of 6, glucose 129, BUN is 13, creatinine 0.7, protein is 9.1,  albumin is 3.9, globulin is 5.2, ALT is 26, AST is 38, alkaline phosphatase  92. CPK is 168. Troponin I is less than 0.04. Urinalysis shows trace amount  of blood and proteinuria. INR is 2.1. Alcohol level is less than 10. CAT  scan of the head shows no acute infarct, mild periventricular white matter  disease.    ASSESSMENT AND PLAN:  1. The patient is admitted for observation as she had altered mental  status. Initially she was confused and seizure is likely possibility. We  will consult Neurology.  2. Obtain EEG.  3. Check drug screen.  4. Check TSH.                Jaci Lazier, MD    cc:                       Jaci Lazier, MD      SFK/wmx; D: 09/14/2011 04:49 P; T: 09/14/2011 05:24 P; DOC# [id number]; Job#  [id number]

## 2011-09-14 NOTE — Other (Signed)
TRANSFER - OUT REPORT:    Verbal report given to (name) on [name]  being transferred to [facility](unit) for routine progression of care       Report consisted of patient???s Situation, Background, Assessment and   Recommendations(SBAR).     Information from the following report(s) SBAR was reviewed with the receiving nurse.    Opportunity for questions and clarification was provided.

## 2011-09-15 LAB — DRUG SCREEN, URINE
AMPHETAMINES: NEGATIVE
BARBITURATES: NEGATIVE
BENZODIAZEPINES: NEGATIVE
COCAINE: NEGATIVE
METHADONE: NEGATIVE
OPIATES: NEGATIVE
PCP(PHENCYCLIDINE): NEGATIVE
THC (TH-CANNABINOL): POSITIVE — AB

## 2011-09-15 LAB — PROTHROMBIN TIME + INR
INR: 2.2 — ABNORMAL HIGH (ref 0.9–1.1)
Prothrombin time: 23.2 s — ABNORMAL HIGH (ref 9.4–11.7)

## 2011-09-15 LAB — TSH 3RD GENERATION: TSH: 0.56 u[IU]/mL (ref 0.36–3.74)

## 2011-09-15 MED ADMIN — escitalopram (LEXAPRO) tablet 10 mg: ORAL | @ 13:00:00 | NDC 00456201001

## 2011-09-15 MED ADMIN — SALINE PERIPHERAL FLUSH Q8H Soln 5 mL: @ 10:00:00 | NDC 82903065462

## 2011-09-15 MED ADMIN — SALINE PERIPHERAL FLUSH Q8H Soln 5 mL: @ 03:00:00 | NDC 87701099893

## 2011-09-15 MED ADMIN — fluticasone-salmeterol (ADVAIR) 250mcg-50mcg/puff: RESPIRATORY_TRACT | @ 02:00:00 | NDC 71205026260

## 2011-09-15 MED ADMIN — Warfarin- pharmacy to dose: @ 13:00:00 | NDC 00740100004

## 2011-09-15 MED ADMIN — pneumococcal 23-valent (PNEUMOVAX 23) injection 0.5 mL: INTRAMUSCULAR | @ 14:00:00 | NDC 00006494300

## 2011-09-15 MED ADMIN — fluticasone-salmeterol (ADVAIR) 250mcg-50mcg/puff: RESPIRATORY_TRACT | @ 12:00:00 | NDC 00173069604

## 2011-09-15 MED ADMIN — warfarin (COUMADIN) tablet 1 mg: ORAL | @ 22:00:00 | NDC 00056016901

## 2011-09-15 MED ADMIN — acetaminophen (TYLENOL) tablet 650 mg: ORAL | @ 13:00:00 | NDC 50580048790

## 2011-09-15 MED ADMIN — SALINE PERIPHERAL FLUSH Q8H Soln 5 mL: @ 18:00:00 | NDC 87701099893

## 2011-09-15 MED ADMIN — levETIRAcetam (KEPPRA) tablet 500 mg: ORAL | @ 18:00:00 | NDC 68084033711

## 2011-09-15 MED ADMIN — acetaminophen (TYLENOL) tablet 650 mg: ORAL | @ 03:00:00 | NDC 50580050130

## 2011-09-15 MED ADMIN — fluticasone-salmeterol (ADVAIR) 250mcg-50mcg/puff: RESPIRATORY_TRACT | @ 02:00:00 | NDC 00173069604

## 2011-09-15 MED ADMIN — famotidine (PEPCID) tablet 20 mg: ORAL | @ 13:00:00 | NDC 68084017211

## 2011-09-15 MED ADMIN — spironolactone (ALDACTONE) tablet 25 mg: ORAL | @ 13:00:00 | NDC 68084020611

## 2011-09-15 MED ADMIN — famotidine (PEPCID) tablet 20 mg: ORAL | @ 03:00:00 | NDC 68084017211

## 2011-09-15 MED ADMIN — metoprolol-XL (TOPROL-XL) tablet 200 mg: ORAL | @ 13:00:00 | NDC 68084030411

## 2011-09-15 MED ADMIN — famotidine (PEPCID) tablet 20 mg: ORAL | @ 22:00:00 | NDC 00172572880

## 2011-09-15 MED FILL — ACETAMINOPHEN 325 MG TABLET: 325 mg | ORAL | Qty: 2

## 2011-09-15 MED FILL — PNEUMOVAX-23 25 MCG/0.5 ML INJECTION SOLUTION: 25 mcg/0.5 mL | INTRAMUSCULAR | Qty: 0.5

## 2011-09-15 MED FILL — BD POSIFLUSH NORMAL SALINE 0.9 % INJECTION SYRINGE: INTRAMUSCULAR | Qty: 10

## 2011-09-15 MED FILL — ADVAIR DISKUS 250 MCG-50 MCG/DOSE POWDER FOR INHALATION: 250-50 mcg/dose | RESPIRATORY_TRACT | Qty: 14

## 2011-09-15 MED FILL — METOPROLOL SUCCINATE SR 50 MG 24 HR TAB: 50 mg | ORAL | Qty: 4

## 2011-09-15 MED FILL — NICOTINE 21 MG/24 HR DAILY PATCH: 21 mg/24 hr | TRANSDERMAL | Qty: 1

## 2011-09-15 MED FILL — SPIRONOLACTONE 25 MG TAB: 25 mg | ORAL | Qty: 1

## 2011-09-15 MED FILL — LEVETIRACETAM 500 MG TAB: 500 mg | ORAL | Qty: 1

## 2011-09-15 MED FILL — FAMOTIDINE 20 MG TAB: 20 mg | ORAL | Qty: 1

## 2011-09-15 MED FILL — LEXAPRO 10 MG TABLET: 10 mg | ORAL | Qty: 1

## 2011-09-15 MED FILL — WARFARIN 1 MG TAB: 1 mg | ORAL | Qty: 1

## 2011-09-15 NOTE — Progress Notes (Signed)
NUTRITION  RN Nutrition Screening Referral    RECOMMENDATIONS:     1. Continue Cardiac diet d/t medical hx.  2. Ensure once daily    INTERVENTIONS/PLAN:     Follow    SUBJECTIVE/OBJECTIVE:       Diet: Cardiac    Medications: [x]                 Reviewed- aldactone, coumadin.   Labs:    Lab Results   Component Value Date/Time    Sodium 138 09/14/2011  3:20 PM    Potassium 4.6 09/14/2011  3:20 PM    Chloride 107 09/14/2011  3:20 PM    CO2 25 09/14/2011  3:20 PM    Anion gap 6 09/14/2011  3:20 PM    Glucose 129 09/14/2011  3:20 PM    BUN 13 09/14/2011  3:20 PM    Creatinine 0.70 09/14/2011  3:20 PM    Calcium 9.6 09/14/2011  3:20 PM    Magnesium 1.8 04/05/2011  2:00 AM    Albumin 3.9 09/14/2011  3:20 PM     Anthropometrics: IBW : 58.968 kg (130 lb), % IBW (Calculated): 100.77 %, BMI (calculated): 21.2     Wt Readings from Last 5 Encounters:   09/15/11 59.421 kg (131 lb)   05/20/11 60.419 kg (133 lb 3.2 oz)   04/05/11 62.596 kg (138 lb)   01/18/11 60.782 kg (134 lb)   12/31/10 60.2 kg (132 lb 11.5 oz)     Estimated Nutrition Needs: 1593 Kcals/day (-1770 (27-30 kcals/kg)), Protein (g): 59 g (1 g/kg)  1 ml fluid/kcal/day  Based on:   [x]           Actual BW    []           IBW              ASSESSMENT:     RN referral triggered by poor po and wt loss. Documented wts vary, but trend down recently from 138 to 131 lbs x 5 months which is mild in nature. BMI adequate and albumin intact. Observed 100% of sandwich consumed at lunch, just admitted so limited po intake to assess.    Pt interested in trying a supplement-Ensure once daily.     Nutrition Diagnoses: Unintentional wt loss r/t decreased appetite as evidence 7 lb wt loss over 5 months.    Nutrition Interventions:  Intervention :Food/Nutrient Delivery (ND): Yes  Goal: Consume 50% of meals/supplements within 7 days     [x]   No cultural, religious, or ethnic dietary needs identified    []   Cultural, religious and ethnic food preferences identified and addressed    []   Participated in  care plan, discharge planning/Interdisciplinary rounds       Nutrition Risk:  []    High     []   Moderate    [x]   Minimal/Uncompromised    Sung Amabile Coral Soler RD  Pager [phone]

## 2011-09-15 NOTE — Progress Notes (Signed)
Hospitalist Progress Note         NAME: Alyssa Hawkins        DOB:  [birth year]        MRN:  [id number]   DATE: 09/15/2011       Assessment / Plan:  AMS- suspected post ictal  Presently back to baseline. Await neurology consult.   Afib  Chronic anticoagulation  Therapeutic INR, continue home medications.   COPD  CAD  HTN  Continue home medications.      Disposition- home when cleared by neurology.                         Subjective: f/u of above.     Chief Complaint: some neck pain o/w no complaints.    Review of Systems:  Symptom Y/N Comments  Symptom Y/N Comments   Fever/Chills n   Chest Pain n    Poor Appetite    Edema     Cough n   Abdominal Pain n    Sputum    Joint Pain     SOB/DOE n   Pruritis/Rash     Nausea/vomit n   Tolerating PT/OT     Diarrhea    Tolerating Diet     Constipation    Other       Could NOT obtain due to:      Objective:     VITALS:   Last 24hrs VS reviewed since prior progress note. Most recent are:  Visit Vitals   Item Reading   ??? BP 130/73   ??? Pulse 83   ??? Temp 98.4 ??F (36.9 ??C)   ??? Resp 16   ??? Ht 5\' 6"  (1.676 m)   ??? Wt 131 lb   ??? BMI 21.14 kg/m2   ??? SpO2 97%       Intake/Output Summary (Last 24 hours) at 09/15/11 1149  Last data filed at 09/15/11 0916   Gross per 24 hour   Intake    440 ml   Output      0 ml   Net    440 ml        PHYSICAL EXAM:  General: WD, WN. Alert,  Not in acute distress  EENT:  Anicteric sclerae,  Resp:  Clear lungs, no wheezing or rales.  No accessory muscle use  CV:  Regular  rhythm,?? No edema  GI:  Soft, Non distended, Non tender. ??+Bowel sounds,   Neurologic:?? Alert and oriented X 3.    Psych:???? Good insight.??Not anxious nor agitated.  Skin:  no rashes or ulcers.  No jaundice.     Lab Data Personally Reviewed: (see below)    Medications list Personally Reviewed   [x]          Yes     []           No    PMH/SH reviewed -   [x]                              no change compared to H&P   ________________________________________________________________________  Care Plan discussed with:    Comments   Patient y    Family  y    RN y    Comptroller:      ________________________________________________________________________  Prophylaxis:  Coumadin  ________________________________________________________________________  Recommended Disposition:   Home w/Family  ________________________________________________________________________  Code Status:  Full Code y   DNR/DNI    ________________________________________________________________________  Total NON critical care TIME:    Minutes 25    Total CRITICAL CARE TIME Spent:   Minutes      Comments   >50% of visit spent in counseling and coordination of care     ________________________________________________________________________  Kennyth Arnold, MD       Procedures: see electronic medical records for all procedures/Xrays and details which were not copied into this note but were reviewed prior to creation of Plan.      LABS:  I reviewed today's most current labs and imaging studies.  Pertinent labs include:  Recent Labs   Basename 09/14/11 1520    WBC 5.1    HGB 14.5    HCT 43.4    PLT 235     Recent Labs   Basename 09/15/11 0345 09/14/11 1520    NA -- 138    K -- 4.6    CL -- 107    CO2 -- 25    GLU -- 129*    BUN -- 13    CREA -- 0.70    CA -- 9.6    MG -- --    PHOS -- --    ALB -- 3.9    TBIL -- 0.5    SGOT -- 38*    INR 2.2* 2.1*

## 2011-09-15 NOTE — Progress Notes (Signed)
physical Therapy EVALUATION/DISCHARGE with functional measure  Patient: Alyssa Hawkins (71 y.o. female)  Date: 09/15/2011  Primary Diagnosis: encephalopathy        Precautions:   Fall  ASSESSMENT :  Based on the objective data described below, the patient presents physically at her baseline. She was able to ambulate without LOB, no instability. Tinetti Balance Test performed with score of 28/28 indicating low risk of falls. Anticipate pt will be safe for d/c home with family and no additional skilled PT services. Will complete current PT orders. Thanks for the opportunity to work with Mrs. Whitmyer.    Skilled physical therapy is not indicated at this time.     PLAN :  Discharge Recommendations: None  Further Equipment Recommendations for Discharge: none     SUBJECTIVE:   Patient stated ???Everything is back to normal.???    OBJECTIVE DATA SUMMARY:     Past Medical History   Diagnosis Date   ??? Hypertension    ??? COPD    ??? CAD (coronary artery disease)    ??? HTN (hypertension)    ??? S/P MVR (mitral valve replacement)    ??? Coronary atherosclerosis of native coronary artery 01/24/2011   ??? 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 Valve replacement only each    ??? Hx aortic valve replacement    ??? Hx tubal ligation      Prior Level of Function/Home Situation: indep   Home Situation  Home Environment: Private residence  # Steps to Enter: 3   Rails to Enter: No  One/Two Story Residence: One story  Living Alone: No  Support Systems: Child(ren)  Patient Expects to be Discharged to:: Private residence  Current DME Used/Available at Home: None  Critical Behavior:  Neurologic State: Alert  Orientation Level: Oriented X4  Cognition: Appropriate decision making;Appropriate for age attention/concentration;Follows commands  Safety/Judgement: Awareness of environment  Skin:  Appears intact  Strength:    Strength: Within functional limits     Tone & Sensation:   Tone:  Normal  Sensation: Intact        Range Of Motion:  AROM: Within functional limits     Coordination:  Coordination: Within functional limits    Functional Mobility:  Bed Mobility:  Supine to Sit: Independent  Sit to Supine: Independent     Transfers:  Sit to Stand: Independent  Stand to Sit: Independent           Balance:   Sitting: Intact  Standing: Intact  Ambulation/Gait Training:  Gait Description (WDL): Within defined limits (Pt able to ambulate without LOB, no instability)         Tinetti test:    Sitting Balance: 1  Arises: 2  Attempts to Rise: 2  Immediate Standing Balance: 2  Standing Balance: 2  Nudged: 2  Eyes Closed: 1  Turn 360 Degrees - Continuous/Discontinuous: 1  Turn 360 Degrees - Steady/Unsteady: 1  Sitting Down: 2  Balance Score: 16   Indication of Gait: 1  R Step Length/Height: 1  L Step Length/Height: 1  R Foot Clearance: 1  L Foot Clearance: 1  Step Symmetry: 1  Step Continuity: 1  Path: 2  Trunk: 2  Walking Time: 1  Gait Score: 12   Total Score: 28            Tinetti Tool Score Risk of Falls  ?18 High  19-23 Moderate  ?24 Low  Balance Score + Gait Score 0 = Minimum 28 = Maximum  Gait Score 0 = Minimum 12 = Maximum  Tinetti ME. Performance-Oriented Assessment of Mobility Problems in Elderly Patients. JAGS 1986; [id number].    Older adults: Lonn Georgia et al, 2009; n = 1000 [place] elderly evaluated with ABC, POMA, ADL, and IADL)  ?? Mean POMA score for males aged 65-79 years = 26.21(3.40)  ?? Mean POMA score for females age 63-79 years = 25.16(4.30)  ?? Mean POMA score for males over 80 years = 23.29(6.02)  ?? Mean POMA score for females over 80 years = 17.20(8.32)         Pain:  Pain Scale 1: Numeric (0 - 10)  Pain Intensity 1: 2  Pain Location 1: Neck  Activity Tolerance:   NAD, VSS  Please refer to the flowsheet for vital signs taken during this treatment.  After treatment:   []    Patient left in no apparent distress sitting up in chair  [x]    Patient left in no apparent distress in bed  [x]    Call bell  left within reach  [x]    Nursing notified  [x]    Caregiver present  []    Bed alarm activated    COMMUNICATION/EDUCATION:   Communication/Collaboration:  [x]    Fall prevention education was provided and the patient/caregiver indicated understanding.  [x]    Patient/family have participated as able and agree with findings and recommendations.  []    Patient is unable to participate in plan of care at this time.  Findings and recommendations were discussed with: Registered Nurse    Thank you for this referral.  Vonzell Schlatter Vellucci, PT   Time Calculation: 15 mins

## 2011-09-15 NOTE — Consults (Signed)
Name:       Alyssa Hawkins, Alyssa Hawkins    Admitted:          09/14/2011                                         DOB:               1940-09-29  Account #:  [id number]               Age:               71  Consultant: Nestor Lewandowsky, MD     Location                                CONSULTATION REPORT    DATE OF CONSULTATION           09/14/2011      CHIEF COMPLAINT: Altered mental status.    HISTORY OF PRESENT ILLNESS: The patient is a 71 year old woman for whom  consultation has been requested by the hospitalist service regarding an  acute neurological event. The patient was found by her daughter yesterday.  The patient was asleep when the daughter left the house and when she came  back, she found her mother on the floor. She was confused. She bit her  tongue.    She has a history of seizures about 13 years ago. She was on Dilantin and  carbamazepine. She was treated in [place] those. They stopped for  unclear reasons and she is not on any anticonvulsants. She had no  incontinence with her current spell.    PAST MEDICAL HISTORY  1. Atrial fibrillation.  2. Hypertension.  3. Chronic obstructive pulmonary disease.  4. Coronary artery disease.  5. Aortic valve replacement with a mechanical valve.    SOCIAL HISTORY: She smokes a pack a day. She smokes marijuana. She likes to  stay up late playing video games. She does not drive.    FAMILY HISTORY: No history of seizures.    MEDICATIONS PRIOR TO ADMISSION  1. Lexapro 10 mg daily.  2. Pepcid 20 mg b.i.d.  3. Spironolactone 25 mg daily.  4. Coumadin.  5. Toprol-XL 200 mg daily.  6. Advair Diskus 1 puff q.12h.    ALLERGIES  1. CLINDAMYCIN.  7. PENICILLIN.    REVIEW OF SYSTEMS: As noted in the HPI and PMH. She denies having any chest  pain, shortness of breath or palpitations. All other systems reviewed and  are negative.    PHYSICAL EXAMINATION  GENERAL: An elderly woman in no distress.  VITAL SIGNS: Temperature 98.4, pulse 83, respirations 16, blood pressure  130/73.   NECK: Without bruit.  HEART: Regular rate and rhythm.  HEENT: Fundi poorly visualized. She has a mild right-sided tongue  laceration.  NEUROLOGIC: Alert, fully oriented, attentive and without aphasia. There are  some limitations in her fund of knowledge and much of the history regarding  her prior seizures comes from her daughter. Grossly normal memory. Pupils  equal, round, react to light. Extraocular movements intact. Face symmetric.  Tongue midline. Palate elevates symmetrically. Normal sensation on the  face. No dysarthria. No drift. Normal coordination. Normal tone, bulk and  strength. DTRs 2+ throughout with absent ankle jerks. Toes downgoing.  Normal temperature and vibration sensation.  DATA: Images and report from a CT scan of the head were reviewed. This  shows mild white matter changes. EEG shows bilateral independent, mid  temporal spikes.  LABORATORY DATA: Remarkable for an INR of 2.2 and urine drug screen is  positive for THC.    I discussed the case with the hospitalist.    IMPRESSION  1. Likely seizure. It would appear that she was found postictal. She bit  her tongue and has a history of seizures in the past and I think this is  the most likely thing. The abnormal EEG suggests a high risk for seizure  recurrence without treatment.  2. Atrial fibrillation, on Coumadin.  3. Status post aortic valve replacement.    RECOMMENDATIONS  1. Start Keppra 500 mg b.i.d. I explained potential side effects. We will  want to watch out in particular for worsening depression. If that becomes  an issue, we could consider switching to Lamictal.  2. She does not drive.  3. MRI scan of the brain with and without contrast to look for a seizure  focus, particularly given the duration since her last seizure.  4. I do not think she needs any further workup if her MRI is negative to  that.  5. Followup in our office in about 2 months.        Reviewed on 09/15/2011 1:59 PM                Nestor Lewandowsky, MD    cc:                        Jaci Lazier, MD                            Nestor Lewandowsky, MD  DR. CHARLES RULA      JJW/wmx; D: 09/15/2011 01:15 P; T: 09/15/2011 01:38 P; DOC# [id number]; Job#  [id number]

## 2011-09-15 NOTE — Progress Notes (Signed)
Bedside shift change report given to Candace rn (oncoming nurse) by Ardine Eng (offgoing nurse).  Report given with SBAR.

## 2011-09-15 NOTE — Progress Notes (Signed)
Bedside and Verbal shift change report given to Anne M RN (oncoming nurse) by Helena RN (offgoing nurse).  Report given with SBAR, Intake/Output, MAR and Recent Results.

## 2011-09-15 NOTE — Consults (Signed)
Dict [id number].  Probable seizure.  Start Keppra.  MRI Brain.

## 2011-09-15 NOTE — Progress Notes (Signed)
Bedside and Verbal shift change report given to Victorino Dike (Cabin crew) by Tami Ribas (offgoing nurse).  Report given with SBAR, Kardex, ED Summary, Procedure Summary, Intake/Output, MAR and Recent Results.

## 2011-09-16 DIAGNOSIS — G40209 Localization-related (focal) (partial) symptomatic epilepsy and epileptic syndromes with complex partial seizures, not intractable, without status epilepticus: Secondary | ICD-10-CM

## 2011-09-16 LAB — PROTHROMBIN TIME + INR
INR: 2.6 — ABNORMAL HIGH (ref 0.9–1.1)
Prothrombin time: 27.3 s — ABNORMAL HIGH (ref 9.4–11.7)

## 2011-09-16 MED ORDER — GADOPENTETATE DIMEGLUMINE 7.5 MMOL/15 ML (469.01 MG/ML) IV
7.5 mmol/15 mL (469.01 mg/mL) | Freq: Once | INTRAVENOUS | Status: AC
Start: 2011-09-16 — End: 2011-09-15
  Administered 2011-09-16: via INTRAVENOUS

## 2011-09-16 MED ORDER — LEVETIRACETAM 500 MG TAB
500 mg | ORAL_TABLET | Freq: Two times a day (BID) | ORAL | Status: DC
Start: 2011-09-16 — End: 2012-05-08

## 2011-09-16 MED ADMIN — fluticasone-salmeterol (ADVAIR) 250mcg-50mcg/puff: RESPIRATORY_TRACT | @ 15:00:00 | NDC 00173069604

## 2011-09-16 MED ADMIN — escitalopram (LEXAPRO) tablet 10 mg: ORAL | @ 14:00:00 | NDC 00093585119

## 2011-09-16 MED ADMIN — acetaminophen (TYLENOL) tablet 650 mg: ORAL | @ 17:00:00 | NDC 50580050130

## 2011-09-16 MED ADMIN — sodium chloride (NS) flush 10 mL: INTRAVENOUS | NDC 87701099893

## 2011-09-16 MED ADMIN — spironolactone (ALDACTONE) tablet 25 mg: ORAL | @ 14:00:00 | NDC 68084020611

## 2011-09-16 MED ADMIN — SALINE PERIPHERAL FLUSH Q8H Soln 5 mL: @ 04:00:00 | NDC 82903065462

## 2011-09-16 MED ADMIN — fluticasone-salmeterol (ADVAIR) 250mcg-50mcg/puff: RESPIRATORY_TRACT | @ 03:00:00 | NDC 00173069604

## 2011-09-16 MED ADMIN — SALINE PERIPHERAL FLUSH Q8H Soln 5 mL: @ 10:00:00 | NDC 87701099893

## 2011-09-16 MED ADMIN — famotidine (PEPCID) tablet 20 mg: ORAL | @ 14:00:00 | NDC 68084017211

## 2011-09-16 MED ADMIN — metoprolol-XL (TOPROL-XL) tablet 200 mg: ORAL | @ 14:00:00 | NDC 68084030411

## 2011-09-16 MED ADMIN — levETIRAcetam (KEPPRA) tablet 500 mg: ORAL | @ 14:00:00 | NDC 68084033711

## 2011-09-16 MED ADMIN — levETIRAcetam (KEPPRA) tablet 500 mg: ORAL | @ 03:00:00 | NDC 68084033711

## 2011-09-16 MED ADMIN — acetaminophen (TYLENOL) tablet 650 mg: ORAL | @ 01:00:00 | NDC 50580050130

## 2011-09-16 MED FILL — LEVETIRACETAM 500 MG TAB: 500 mg | ORAL | Qty: 1

## 2011-09-16 MED FILL — ACETAMINOPHEN 325 MG TABLET: 325 mg | ORAL | Qty: 2

## 2011-09-16 MED FILL — SPIRONOLACTONE 25 MG TAB: 25 mg | ORAL | Qty: 1

## 2011-09-16 MED FILL — FAMOTIDINE 20 MG TAB: 20 mg | ORAL | Qty: 1

## 2011-09-16 MED FILL — LEXAPRO 10 MG TABLET: 10 mg | ORAL | Qty: 1

## 2011-09-16 MED FILL — MAGNEVIST 7.5 MMOL/15 ML (469.01 MG/ML) INTRAVENOUS SOLUTION: 7.5 mmol/15 mL (469.01 mg/mL) | INTRAVENOUS | Qty: 15

## 2011-09-16 MED FILL — METOPROLOL SUCCINATE SR 50 MG 24 HR TAB: 50 mg | ORAL | Qty: 4

## 2011-09-16 NOTE — Progress Notes (Signed)
Location: [facility] - [id number]  Attn.: Carrolyn Meiers F  DOB: 1940/08/04 / Age: 71  MR#: [id number] / Admit#: [id number]  Pt. First Name: Alyssa  Pt. Last Name: Sharrie Hawkins [facility]  [address]  [place], [place]  [address]                        Case Management - Progress Note  Initial Open Date: 09/15/2011  Case Manager:    Initial Open Date: 09/16/2011  Social Worker: Wynn Maudlin Helton  Expected Date of Discharge: 09/16/2011  Transferred From: home  ECF Bed Held Until:  Bed Held By:  Power of Attorney:  POA/Guardian/Conservator Capacity:  Primary Caregiver: Jerrye Beavers at [phone]  Living Arrangements: Relative's Home  Source of Income:  Payee:  Psychosocial History:  Cultural/Religious/Language Issues:  Education Level:  ADLS/Current Living Arrangements Issues:  Past Providers:  Will patient perform self care at discharge? Y  Anticipated Discharge Disposition Goal: Return to admission address  Assessment/Plan:      09/16/2011 11:05A Cm received a consult for "discharge needs" but noted  that patient is being d/c today.  Chart reviewed.  Pt was evaluated by  therapy and is at her baseline.  Cm will sign off.  Alyssa Hawkins, [place] [employer]            09/15/2011 09:41A chart reviewed for medical necessity and discharge  planning. patient lives with family.  Tildon Husky RNCRM  Resources at Discharge:  Service Providers at Discharge:  Dictating Provider:  Para March

## 2011-09-16 NOTE — Progress Notes (Signed)
Neurology Progress Note    Patient ID:  Alyssa Hawkins  [id number]  71 y.o.  10-06-1940    Subjective:     Chief complaint: Seizure    HPI: Patient without new complaints.  Seen for likely seizure.  Started on Keppra.    Review of Systems:  As noted in the HPI.  Mild dizziness yesterday.  No blurred vision.    Current Facility-Administered Medications   Medication Dose Route Frequency   ??? nicotine (NICODERM CQ) 21 mg/24 hr patch 1 Patch  1 Patch TransDERmal DAILY   ??? pneumococcal 23-valent (PNEUMOVAX 23) injection 0.5 mL  0.5 mL IntraMUSCular ONCE   ??? warfarin (COUMADIN) tablet 1 mg  1 mg Oral ONCE   ??? levETIRAcetam (KEPPRA) tablet 500 mg  500 mg Oral Q12H   ??? gadopentetate dimeglumine (MAGNEVIST) 7.5 mmol/15 mL (469.01 mg/mL) contrast solution 12 mL  12 mL IntraVENous ONCE   ??? sodium chloride (NS) flush 10 mL  10 mL IntraVENous ONCE   ??? escitalopram (LEXAPRO) tablet 10 mg  10 mg Oral DAILY   ??? famotidine (PEPCID) tablet 20 mg  20 mg Oral BID   ??? fluticasone-salmeterol (ADVAIR) 280mcg-50mcg/puff  1 Puff Inhalation BID RT   ??? metoprolol-XL (TOPROL-XL) tablet 200 mg  200 mg Oral DAILY   ??? spironolactone (ALDACTONE) tablet 25 mg  25 mg Oral DAILY   ??? Warfarin- pharmacy to dose  1 Each Other DAILY   ??? acetaminophen (TYLENOL) tablet 650 mg  650 mg Oral Q6H PRN   ??? SALINE PERIPHERAL FLUSH Q8H Soln 5 mL  5 mL InterCATHeter Q8H            Objective:     Visit Vitals   Item Reading   ??? BP 153/80   ??? Pulse 93   ??? Temp 99.5 ??F (37.5 ??C)   ??? Resp 17   ??? Ht 5\' 6"  (1.676 m)   ??? Wt 59 kg (130 lb 1.1 oz)   ??? BMI 20.99 kg/m2   ??? SpO2 97%         05/17 0700 - 05/17 1859  In: 120 [P.O.:120]  Out: -   05/15 1900 - 05/17 0659  In: 1760 [P.O.:1760]  Out: -     Exam:  Alert. Fully oriented. Attentive. No aphasia.  PERRL. EOMI. Face symmetric. Tongue midline.  No drift. Normal coordination.  DTR's symmetric. Toes downgoing.     Lab Review   Recent Results (from the past 24 hour(s))   PROTHROMBIN TIME    Collection Time    09/16/11   3:15 AM       Component Value Range    INR 2.6 (*) 0.9 - 1.1      Prothrombin time 27.3 (*) 9.4 - 11.7 sec       Additional comments:I reviewed the patient's new clinical lab test results.   Images, report MRI Brain reviewed.  Normal.          Assessment:   Seizure disorder    Principal Problem:   *Encephalopathy, unspecified (09/14/2011)  Active Problems:     Localization-related (focal) (partial) epilepsy and epileptic syndromes with complex partial seizures, without mention of intractable epilepsy (09/16/2011)      Plan:   Continue Keppra  Follow up in my office in about 2 months  Will see PRN      Signed:  Nestor Lewandowsky, MD  09/16/2011  9:39 AM

## 2011-09-16 NOTE — Progress Notes (Signed)
I have reviewed discharge instructions with the patient and caregiver.  The patient and caregiver verbalized understanding.  Patient's Rx called into [facility].  Patient states she is able to get to her Follow up appointment via her daughter.  Patient has micromedix and is able to verbalize signs and symptoms to report to the doctor.

## 2011-09-16 NOTE — Discharge Summary (Signed)
Physician Discharge Summary     Patient ID:    Alyssa Hawkins  [id number]  71 y.o.  06/24/1940    Admit date: 09/14/2011    Discharge date and time: 09/16/2011    Admission Diagnoses: encephalopathy    Chronic Diagnoses:    Patient Active Problem List   Diagnoses Code   ??? HTN (hypertension) 401.9   ??? Atrial fibrillation 427.31   ??? S/P MVR (mitral valve replacement) V43.3   ??? Coronary atherosclerosis of native coronary artery 414.01   ??? Subendocardial infarction, subsequent episode of care 410.72   ??? Encephalopathy, unspecified 348.30   ??? Localization-related (focal) (partial) epilepsy and epileptic syndromes with complex partial seizures, without mention of intractable epilepsy 345.40       Discharge Medications:   Current Discharge Medication List      START taking these medications    Details   levETIRAcetam (KEPPRA) 500 mg tablet Take 1 Tab by mouth every twelve (12) hours.  Qty: 60 Tab, Refills: 5         CONTINUE these medications which have NOT CHANGED    Details   spironolactone (ALDACTONE) 25 mg tablet Take 25 mg by mouth daily.      famotidine (PEPCID) 20 mg tablet Take 20 mg by mouth two (2) times daily as needed. Indications: indigestion      !! warfarin (COUMADIN) 2 mg tablet Take 0.5 mg by mouth five (5) days a week. Family reports that patient takes 0.5 mg every day except for Tu/Thurs.  Patient has a 2 mg tablet at home, but patient could not confirm that she is cutting this pill in quarters.  Family is positive that the dose is 0.5 mg      !! warfarin (COUMADIN) 2 mg tablet Take 1 mg by mouth every Tuesday and Thursday.      metoprolol-XL (TOPROL-XL) 200 mg XL tablet Take 200 mg by mouth daily.        escitalopram (LEXAPRO) 10 mg tablet Take 10 mg by mouth daily.        fluticasone-salmeterol (ADVAIR DISKUS) 250-50 mcg/dose diskus inhaler Take 1 Puff by inhalation every twelve (12) hours.         !! - Potential duplicate medications found. Please discuss with provider.           Follow up  Care:    1. CHARLES A RULA, MD in 1-2 weeks  2. Dr. Maurene Capes    Diet:  Cardiac Diet    Disposition:  Home.    Advanced Directive:    Discharge Exam:  Awake, alert, no distress.    CONSULTATIONS: neurology    Significant Diagnostic Studies:   Recent Labs   [facility] 09/14/11 1520    WBC 5.1    HGB 14.5    HCT 43.4    PLT 235     Recent Labs   Basename 09/14/11 1520    NA 138    K 4.6    CL 107    CO2 25    BUN 13    CREA 0.70    GLU 129*    CA 9.6    MG --    PHOS --    URICA --     Recent Labs   Basename 09/14/11 1520    SGOT 38*    GPT 26    AP 92    TBIL 0.5    TP 9.1*    ALB 3.9    GLOB 5.2*  GGT --    AML --    LPSE --     Recent Labs   Basename 09/16/11 0315 09/15/11 0345 09/14/11 1520    INR 2.6* 2.2* 2.1*    PTP 27.3* 23.2* 21.5*    APTT -- -- --      No results found for this basename: FE:2,TIBC:2,PSAT:2,FERR:2, in the last 72 hours   No results found for this basename: PH:2,PCO2:2,PO2:2, in the last 72 hours  Recent Labs   Basename 09/14/11 1520    CPK 168    CKMB --     No results found for this basename: [facility]             HOSPITAL COURSE & TREATMENT RENDERED:   AMS- suspected post ictal   Presently back to baseline. Started on keppra by neurology. Patient should not drive until cleared by neurology.  Afib   Chronic anticoagulation   Therapeutic INR, continue home medications.   COPD   CAD   HTN   Continue home medications  Please see H&P, progress and consult notes for details.      Signed:  Kennyth Arnold, MD  09/16/2011  10:57 AM      Time spent : < 30 min.

## 2011-09-16 NOTE — Procedures (Signed)
Name:      Dubie, Machele ELOISE       Admitted:   09/14/2011                                           DOB:        10/08/1940  Account #: [id number]                  Age         70  Ref MD:                                  Location:  DATE:      09/14/2011                              ELECTROENCEPHALOGRAM REPORT      EEG NUMBER: [id number]    CONDITION OF THIS RECORDING: A routine digital 21-lead EEG was performed in  accordance with the international 10-20 system.    HISTORY: A 70-year-old woman with a history of prior seizures, who was  found unresponsive with a tongue laceration.    MEDICATIONS: No anticonvulsants.    FINDINGS: The background is well organized with an anterior-to-posterior  gradient and a posterior dominant rhythm of 10 Hz. There was intermittent  slowing seen in the left temporal region, particularly during drowsiness.  There were occasional spikes seen, maximal at T3, and independently at T4.  These seem to occur with roughly equal frequency on both sides. Photic  stimulation did not provoke abnormalities. Hyperventilation was not  performed. Stage II sleep was not recorded.    CONCLUSION  Abnormal EEG due to the presence of:  1. Intermittent left temporal slowing, indicating cerebral dysfunction in  that area. 2. Bilateral independent mid temporal spikes, indicating  potentially epileptogenic foci in those regions.              Marek Nghiem J Wittman, Jr, MD    cc:   Johnanthony Wilden J Wittman, Jr, MD      JJW/wmx; D: 09/15/2011 04:59 P; T: 09/16/2011 11:41 A; DOC# [id number]; Job#  [id number]

## 2011-09-16 NOTE — Progress Notes (Signed)
Bedside shift change report given to Colleen, RN (oncoming nurse).  Report given with SBAR, Kardex, Intake/Output, MAR and Recent Results.

## 2011-09-16 NOTE — Progress Notes (Signed)
Bedside and Verbal shift change report given to Amanda RN (oncoming nurse) by Candaice RN (offgoing nurse).  Report given with SBAR, Kardex, Procedure Summary, Intake/Output, MAR and Recent Results.

## 2011-09-16 NOTE — Procedures (Signed)
Name:      Alyssa Hawkins, Alyssa Hawkins       Admitted:   09/14/2011                                           DOB:        1941/03/19  Account #: [id number]                  Age         71  Ref MD:                                  Location:  DATE:      09/14/2011                              ELECTROENCEPHALOGRAM REPORT      EEG NUMBER: [id number]    CONDITION OF THIS RECORDING: A routine digital 21-lead EEG was performed in  accordance with the international 10-20 system.    HISTORY: A 71 year old woman with a history of prior seizures, who was  found unresponsive with a tongue laceration.    MEDICATIONS: No anticonvulsants.    FINDINGS: The background is well organized with an anterior-to-posterior  gradient and a posterior dominant rhythm of 10 Hz. There was intermittent  slowing seen in the left temporal region, particularly during drowsiness.  There were occasional spikes seen, maximal at T3, and independently at T4.  These seem to occur with roughly equal frequency on both sides. Photic  stimulation did not provoke abnormalities. Hyperventilation was not  performed. Stage II sleep was not recorded.    CONCLUSION  Abnormal EEG due to the presence of:  1. Intermittent left temporal slowing, indicating cerebral dysfunction in  that area. 2. Bilateral independent mid temporal spikes, indicating  potentially epileptogenic foci in those regions.              Nestor Lewandowsky, MD    cc:   Nestor Lewandowsky, MD      JJW/wmx; D: 09/15/2011 04:59 P; T: 09/16/2011 11:41 A; DOC# [id number]; Job#  [id number]

## 2012-03-21 NOTE — Addendum Note (Signed)
Addended by: Baron Sane on: 03/21/2012 10:51 AM     Modules accepted: Orders, Level of Service, SmartSet

## 2012-03-21 NOTE — Progress Notes (Signed)
Pt here with daughter to establish care.  Pt c/o of constant generalized pain throughout her body.  Daughter concerned that pt has developed an infection in her mouth and feet.  Requesting refills on medication.

## 2012-03-21 NOTE — Progress Notes (Signed)
Pt here with daughter to establish care.  Pt c/o of constant generalized pain throughout her body.  Daughter concerned that pt has developed an infection in her mouth and feet.  Requesting refills on medication.     Pt reports that her coumadin was a 2.8, goes to the coumadin clinic at [facility].     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     Subjective: (As above and below)     Chief Complaint   Patient presents with   ??? Establish Care     she is a 71 y.o. year old female who presents for evaluation.    Reviewed PmHx, RxHx, FmHx, SocHx, AllgHx and updated in chart.    Review of Systems - negative except as listed above    Objective:     Filed Vitals:    03/21/12 1003   BP: 136/85   Pulse: 90   Height: 5\' 6"  (1.676 m)   Weight: 125 lb (56.7 kg)     Physical Examination: General appearance - alert, well appearing, and in no distress  Eyes - pupils equal and reactive, extraocular eye movements intact  Mouth - mucous membranes moist, pharynx normal without lesions  Chest - clear to auscultation, no wheezes, rales or rhonchi, symmetric air entry  Heart - normal rate, regular rhythm, normal S1, S2, no murmurs, rubs, clicks or gallops  Musculoskeletal - no joint tenderness, deformity or swelling  Extremities - peripheral pulses normal, no pedal edema, no clubbing or cyanosis    Assessment/ Plan:   Calissa was seen today for establish care.    Diagnoses and associated orders for this visit:    HTN (hypertension)  - metoprolol-XL (TOPROL-XL) 200 mg XL tablet; Take 1 Tab by mouth daily.  - CBC WITH AUTOMATED DIFF  - LIPID PANEL  - METABOLIC PANEL, COMPREHENSIVE  - TSH, 3RD GENERATION    Atrial fibrillation  -continue coumadin    Dental infection  - ciprofloxacin (CIPRO) 500 mg tablet; Take 1 Tab by mouth two (2) times a day for 10 days.  - traMADol (ULTRAM) 50 mg tablet; Take 1 Tab by mouth every six (6) hours as needed for Pain.    Memory loss  - donepezil  (ARICEPT) 5 mg tablet; Take 1 Tab by mouth nightly.    Depression  - sertraline (ZOLOFT) 50 mg tablet; Take 1 Tab by mouth daily.  - traZODone (DESYREL) 50 mg tablet; Take 1 Tab by mouth nightly.    GERD (gastroesophageal reflux disease)  - esomeprazole (NEXIUM) 40 mg capsule; Take 1 Cap by mouth daily.    Other Orders  - fluticasone-salmeterol (ADVAIR DISKUS) 250-50 mcg/dose diskus inhaler; Take 1 Puff by inhalation every twelve (12) hours.         Follow-up Disposition: As needed  I have discussed the diagnosis with the patient and the intended plan as seen in the above orders.  The patient has received an after-visit summary and questions were answered concerning future plans.     Medication Side Effects and Warnings were discussed with patient: yes  Patient Labs were reviewed: yes  Patient Past Records were reviewed:  yes    Pat Patrick, M.D.

## 2012-03-21 NOTE — Patient Instructions (Signed)
1. Please take 1mg  (half a tablet of coumadin) everyday until antibiotic complete.   2. Please have coumadin rechecked in 5 days.

## 2012-03-22 LAB — CBC WITH AUTOMATED DIFF
ABS. BASOPHILS: 0 10*3/uL (ref 0.0–0.2)
ABS. EOSINOPHILS: 0 10*3/uL (ref 0.0–0.4)
ABS. IMM. GRANS.: 0 10*3/uL (ref 0.0–0.1)
ABS. MONOCYTES: 0.5 10*3/uL (ref 0.1–0.9)
ABS. NEUTROPHILS: 2.7 10*3/uL (ref 1.4–7.0)
Abs Lymphocytes: 1.3 10*3/uL (ref 0.7–3.1)
BASOPHILS: 0 % (ref 0–3)
EOSINOPHILS: 0 % (ref 0–5)
HCT: 37.5 % (ref 34.0–46.6)
HGB: 12.1 g/dL (ref 11.1–15.9)
IMMATURE GRANULOCYTES: 0 % (ref 0–2)
Lymphocytes: 28 % (ref 14–46)
MCH: 26.6 pg (ref 26.6–33.0)
MCHC: 32.3 g/dL (ref 31.5–35.7)
MCV: 82 fL (ref 79–97)
MONOCYTES: 12 % (ref 4–12)
NEUTROPHILS: 60 % (ref 40–74)
PLATELET: 168 10*3/uL (ref 155–379)
RBC: 4.55 x10E6/uL (ref 3.77–5.28)
RDW: 15.3 % (ref 12.3–15.4)
WBC: 4.5 10*3/uL (ref 3.4–10.8)

## 2012-03-22 LAB — METABOLIC PANEL, COMPREHENSIVE
A-G Ratio: 1.3 (ref 1.1–2.5)
ALT (SGPT): 15 IU/L (ref 0–32)
AST (SGOT): 25 IU/L (ref 0–40)
Albumin: 4.3 g/dL (ref 3.5–4.8)
Alk. phosphatase: 100 IU/L (ref 39–117)
BUN/Creatinine ratio: 19 (ref 11–26)
BUN: 18 mg/dL (ref 8–27)
Bilirubin, total: 0.4 mg/dL (ref 0.0–1.2)
CO2: 24 mmol/L (ref 19–28)
Calcium: 9.6 mg/dL (ref 8.6–10.2)
Chloride: 101 mmol/L (ref 97–108)
Creatinine: 0.95 mg/dL (ref 0.57–1.00)
GFR est AA: 70 mL/min/{1.73_m2} (ref >59)
GFR est non-AA: 60 mL/min/{1.73_m2} (ref >59)
GLOBULIN, TOTAL: 3.4 g/dL (ref 1.5–4.5)
Glucose: 89 mg/dL (ref 65–99)
Potassium: 4.4 mmol/L (ref 3.5–5.2)
Protein, total: 7.7 g/dL (ref 6.0–8.5)
Sodium: 141 mmol/L (ref 134–144)

## 2012-03-22 LAB — LIPID PANEL
Cholesterol, total: 166 mg/dL (ref 100–199)
HDL Cholesterol: 52 mg/dL (ref >39)
LDL, calculated: 101 mg/dL — ABNORMAL HIGH (ref 0–99)
Triglyceride: 67 mg/dL (ref 0–149)
VLDL, calculated: 13 mg/dL (ref 5–40)

## 2012-03-22 LAB — TSH 3RD GENERATION: TSH: 3.33 u[IU]/mL (ref 0.450–4.500)

## 2012-03-22 NOTE — Progress Notes (Signed)
Quick Note:    All labs are within normal limits.   Please inform  ______

## 2012-03-22 NOTE — Progress Notes (Signed)
Quick Note:    Notified pts niece of results (on HIPPA)  ______

## 2012-03-26 NOTE — Telephone Encounter (Signed)
PA completed and awaiting approval for nexium

## 2012-03-26 NOTE — Progress Notes (Signed)
Patient's hospital notes have been scanned into chart

## 2012-05-07 ENCOUNTER — Telehealth

## 2012-05-07 NOTE — Telephone Encounter (Signed)
Pt caregiver called saying that the aricept 5mg  is causing awful night terrors. The rest of the scripts needs 90 days worth for insurance. [phone] Philbert Riser )

## 2012-05-08 NOTE — Telephone Encounter (Signed)
All other rx have been sent in, please stop Aricept if side effects have been seen.

## 2012-05-08 NOTE — Telephone Encounter (Signed)
Notified Lolita per Dr. Matilde Haymaker. She stated that pt has been off of Aricept x 2 weeks and has not had any more night terrors. Requesting a new Rx be sent to pharmacy in place of Aricept.

## 2012-05-08 NOTE — Telephone Encounter (Signed)
New rx has been sent in as requested.

## 2012-08-02 NOTE — Progress Notes (Signed)
Pt presents in office for c/o SOB.  Reports she has a hx of COPD.  Denies cough, fevers, or congestion.

## 2012-08-02 NOTE — Patient Instructions (Addendum)
Hello,  I think her COPD is having an exacerbation, I have given her an oral medication and an added inhaler to help with this.  If no improvement by Monday please bring her back in for evaluation.     I have refilled her Zoloft, she is not on Lexapro, and increased the dosage. I am hoping the increased dosage will also help with her sleep.    I have referred her to a Neurologist for further assessment of her memory loss. Namenda is also a medication for memory loss and she is already on that.  The neurologist will know if any other medication may be helpful.      I have given her a medication to see if it helps with her stomach pain and frequent trips to the bathroom.  She states she forgets to eat a lot and also she doesn't like eating since will have to go to bathroom immediately afterwards.  The medication I have given her should help with that.  I also recommend having some Ensure supplements around the house that she can drink for "snacks" or when she remembers that will help get her the necessary calories.      Thanks for the note!

## 2012-08-02 NOTE — Progress Notes (Signed)
Chief Complaint   Patient presents with   ??? Breathing Problem     she is a 72 y.o. year old female who presents for evalution.    Pt has been feeling SOB x months, has been worsening in last week or so.  Can't even go across room without getting SOB.  Coughing a lot at night time, unable to sleep.  No wheezing that pt has noticed.  No fever or chills.      Pt also complaining of lack of appetite, whenever eats anything goes to bathroom - poop.  Stool is watery x 1-2 months.  Feels generalized stomach "soreness" - no change if eats or does not eat.  Tried to do dietary recall but pt states she forgets to eat if no one reminds her.  Pt unsure of last date of colonoscopy.    Memory loss has been worsening.  Tried Aricept but cause nightmares and made her feel poorly.  Current on Namenda.      Pt seen alone.  Have note from daughter regarding pt's SOB, depression, memory loss, poor appetite, and problems sleeping.      Reviewed PmHx, RxHx, FmHx, SocHx, AllgHx and updated and dated in the chart.    Review of Systems - negative except as listed above in the HPI    Objective:     Filed Vitals:    08/02/12 1518 08/02/12 1605   BP: 141/105 152/82   Pulse: 84    Temp: 95.8 ??F (35.4 ??C)    TempSrc: Oral    Height: 5\' 6"  (1.676 m)    Weight: 124 lb (56.246 kg)      Physical Examination: General appearance - alert, well appearing, and in no distress  Mental status - alert, oriented to person, place, and time, depressed mood  Neck - supple, no significant adenopathy  Chest - clear to auscultation, no wheezes, rales or rhonchi, symmetric air entry, decreased breath sounds throughout  Heart - normal rate, regular rhythm, normal S1, S2, no murmurs, rubs, clicks or gallops  Abdomen - soft, nontender, nondistended, no masses or organomegaly  Neurological - alert, oriented, normal speech, no focal findings or movement disorder noted, screening mental status exam normal but did take pt long time to come up with answers    Assessment/  Plan:   Alyssa Hawkins was seen today for breathing problem.    Diagnoses and associated orders for this visit:    COPD exacerbation  - predniSONE (STERAPRED DS) 10 mg dose pack; Use as directed on packaging, 12 day  - albuterol (VENTOLIN HFA) 90 mcg/actuation inhaler; Take 2 Puffs by inhalation every six (6) hours as needed for Shortness of Breath. May use ProAir if cheaper  F/U Monday if no improvement     Depression  - sertraline (ZOLOFT) 100 mg tablet; Take 1 Tab by mouth daily.  Increased Zoloft dosage to see if will help, F/U 1 mo.     Memory loss  - REFERRAL TO NEUROLOGY    Abdominal pain  - dicyclomine (BENTYL) 10 mg capsule; Take 1 Cap by mouth Before breakfast, lunch, and dinner. And before bedtime   Take before meals to see if helps with BM after meals and abdominal pain.  Recommend Ensure supplementation if not eating meals.  F/U prn.      Follow-up Disposition:  Return in about 1 month (around 09/01/2012), or if symptoms worsen or fail to improve.    I have discussed the diagnosis with the patient and the intended  plan as seen in the above orders.  The patient has received an after-visit summary and questions were answered concerning future plans.     Medication Side Effects and Warnings were discussed with patient    Carmine Savoy, NP

## 2013-02-06 NOTE — Telephone Encounter (Signed)
Case management referral received.  Outreach is in progress.

## 2013-02-27 NOTE — Telephone Encounter (Signed)
I called the number provided and it was to [facility].  I didn't leave a message.  I called her home number listed and it said it was for mailbox #... I didn't leave a message.

## 2013-02-27 NOTE — Telephone Encounter (Signed)
Attempted to contact patient.  Her line would ring and then beep continuously.  I was calling to tell her that she has not seen Dr Gasper Lloyd since 1/13 and we have never checked her INR here.  She also isn't getting her refills of the Warfarin from [place].  It looks like Dr Risser has been filling her Warfarin erratically.  Im going to refer this to Dr Risser her PCP.

## 2013-02-27 NOTE — Telephone Encounter (Signed)
Pt. Needs to know if she needs to stop coumadin to have a dental procedure done. You can reach her @ [phone] She wants to know if you can fax the instructions to her. You can reach her @ [phone] Thanks, Annabelle Harman

## 2013-03-01 NOTE — Telephone Encounter (Signed)
Unable to reach patient.

## 2013-05-17 NOTE — Telephone Encounter (Signed)
Spoke to EdonMariam.  Ms Alyssa Hawkins hasn't been in the office since 05/2011.   [name] requested we make an appt for Ms Alyssa Hawkins.  Patient's INR's are monitored and adjusted [facility].

## 2013-05-17 NOTE — Telephone Encounter (Signed)
[name] from [facility] called inquiring about dental process being that Ms Alyssa ShelterHarkley has a mechanical valve.  Please give her a call back at [phone]

## 2013-05-29 NOTE — Telephone Encounter (Signed)
Please fax special instructions to dentist office fax # is [phone] attention [name]

## 2013-05-29 NOTE — Telephone Encounter (Signed)
Pt has f/u with Dr. Gasper LloydSabharwal tomorrow. Forwarding to nurse assigned for tomorrow   Future Appointments  Date Time Provider Department Center   05/30/2013 10:00 AM Vipal [facility]

## 2013-05-30 NOTE — Patient Instructions (Signed)
START DIGOXIN 0.125 ONCE A DAY    FOLLOW-UP IN 2 WEEKS WITH AN ECHO

## 2013-05-30 NOTE — Telephone Encounter (Signed)
[name] from [facility] called and would like a return phone call concerning a office note when ready and something about  The patient pt. INR please call her back at [phone] and the fax number is [phone]

## 2013-05-30 NOTE — Progress Notes (Signed)
Alyssa Hawkins     March 27, 1941       Flynn Lininger K. Hanan Moen MD, [facility]  Date of Visit-05/30/2013   PCP is Caron Presume, MD   [facility]  [facility]  HPI:  Alyssa Hawkins is a 73 y.o. female   Hx of MV repair and prosthetic AVR in 2000 in [place]  Admit 2012 with rapid atrial fibrillation with PVCs and troponin of 0.58 of unclear source, has HTN  Last seen Jan 2013  Last echo 05-20-11= EF 55-60, moderate BAE, diastolic dysfunction. Mild-mod MR, prev repair, mild stenosis, Aortic valve mechanical prosthesis fx ok with AVA 1.9 cm2, mean of 11 mm Hg; TR was moderate to severe   [facility] 05-24-11= wal;ke 4'43" ef 68% normal perfusion  Admit [facility] April 2014 with normal stress test with isolated elevated tropoinin  The patient has no chest pain or shortness of breath or edema but does have palpitations.  She reports some rapid heartbeat, feels like an orchestra is going off in her chest.  She had a stress test in 2013 walking 4 minutes and 43 seconds with normal perfusion and has no mitral valve disease and without significant vascular disease.  Assessment/Plan:    She continues on Coumadin for paroxysmal atrial fibrillation and is on Metoprolol 100 mg daily.  She is seen at [facility].  She says she may not have tooth extractions.  I told her she probably should not stop the Coumadin until absolutely necessary.     Her EKG today shows sinus rhythm, left atrial enlargement, nonspecific T-wave abnormalities.     Overall, I think she is tolerating the valve fairly well.  She could use a repeat evaluation of her mitral valve and I have taken out coronary artery disease as I do not see that as an established diagnosis.  She continues to have palpitations which are probably some element of ectopics and I will add Digoxin .125 mg daily.      I will see her back in two months    START DIGOXIN 0.125 ONCE A DAY  FOLLOW-UP IN 2 WEEKS WITH AN ECHO  I have removed CAD since not  established diagnosis  Future Appointments  Date Time Provider Department Center   06/13/2013 10:00 AM Tamala Fothergill, Thad Ranger [facility] SCHED   06/13/2013 10:40 AM Haizlee Henton Gasper Lloyd, MD [facility] SCHED       Impression:   1. Mitral and aortic valve disease    2. PAF (paroxysmal atrial fibrillation)    3. HTN (hypertension)    4. Tricuspid regurgitation    5. Elevated troponin level    6. S/P MVR (mitral valve replacement)     Corrections and substantive additions have been made to the Past Medical History and/or Problem List.       ZOX:WRUEA  Rhythm   -Left atrial enlargement.    -  Nonspecific T-abnormality.        ROS-except as noted above.. A complete cardiac and respiratory are reviewed and negative except as above ; Resp-denies wheezing  or productive cough,. Const- No unusual weight loss or fever; Neuro-no recent seizure or CVA ; GI- No BRBPR, abdom pain, bloating ; GU- no  hematuria   see supplement sheet, initialed and to be scanned by staff  Past Medical History   Diagnosis Date   ??? Hypertension    ??? COPD    ??? CAD (coronary artery disease)    ??? HTN (hypertension)    ???  S/P MVR (mitral valve replacement)    ??? Coronary atherosclerosis of native coronary artery 01/24/2011   ??? 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  She does not have any smokeless tobacco history on file. She reports that  drinks alcohol.     Exam and Labs:  BP 130/70   Pulse 100   Ht 5\' 6"  (1.676 m)   Wt 132 lb 9.6 oz (60.147 kg)   BMI 21.41 kg/m2   Constitutional:  NAD, comfortable ,   Head: [place],AT. Eyes: No scleral icterus. Neck:  Neck supple. No JVD present.Throat: moist mucous membranes.  Chest: Effort normal & normal respiratory excursion .Neurological: alert, conversant and oriented . Skin: Skin is not cold. No obvious systemic rash noted. Not diaphoretic. No erythema. Psychiatric:  Grossly normal mood and affect.  Behavior appears normal. Extremities:  no clubbing  or cyanosis. Abdomen: non distended    Lungs:breath sounds normal. No stridor. distress, wheezes or  Rales.  Heart:normal rate and regular rhythm, S1 and S2 normal, no gallops noted, systolic murmur 1?/6 at 2nd right intercostal space , PMI non displaced.    Edema: Edema is none.     Wt Readings from Last 3 Encounters:   05/30/13 132 lb 9.6 oz (60.147 kg)   08/02/12 124 lb (56.246 kg)   03/21/12 125 lb (56.7 kg)      BP Readings from Last 3 Encounters:   05/30/13 130/70   08/02/12 152/82   03/21/12 136/85        Current Outpatient Prescriptions   Medication Sig   ??? fluticasone-salmeterol (ADVAIR DISKUS) 250-50 mcg/dose diskus inhaler Take 1 puff by inhalation every twelve (12) hours.   ??? levETIRAcetam (KEPPRA) 500 mg tablet Take  by mouth two (2) times a day.   ??? omeprazole (PRILOSEC) 20 mg capsule Take 20 mg by mouth daily.   ??? losartan (COZAAR) 100 mg tablet Take 100 mg by mouth daily.   ??? melatonin 5 mg tab Take  by mouth.   ??? capsicum oleoresin 0.025 % topical cream Apply  to affected area three (3) times daily.   ??? acetaminophen (TYLENOL) 325 mg tablet Take  by mouth every four (4) hours as needed for Pain.   ??? metoprolol succinate (TOPROL XL) 100 mg XL tablet Take  by mouth daily.   ??? warfarin (COUMADIN) 2 mg tablet TAKE 1 TABLET BY MOUTH ONCE DAILY   ??? sertraline (ZOLOFT) 100 mg tablet Take 1 Tab by mouth daily.   ??? albuterol (VENTOLIN HFA) 90 mcg/actuation inhaler Take 2 Puffs by inhalation every six (6) hours as needed for Shortness of Breath. May use ProAir if cheaper   ??? traZODone (DESYREL) 50 mg tablet Take 1 Tab by mouth nightly.   ??? traMADol (ULTRAM) 50 mg tablet Take 1 Tab by mouth every six (6) hours as needed for Pain.   ??? fluticasone (FLONASE) 50 mcg/actuation nasal spray 2 Sprays by Both Nostrils route daily.   ??? spironolactone (ALDACTONE) 25 mg tablet Take 25 mg by mouth daily.     No current facility-administered medications for this visit.      Impression see above.

## 2013-05-30 NOTE — Telephone Encounter (Signed)
Left message for Alyssa Hawkins at [facility] to call back at earliest convenience.

## 2013-05-31 NOTE — Telephone Encounter (Signed)
2 patient identifiers used.  [facility]oke [name] at [facility], patient advised her per Dr. Gasper LloydSabharwal that she did not have to stop her coumadin to have a tooth pulled. [name] needs documentation that this is correct. Please advise?

## 2013-06-06 NOTE — Telephone Encounter (Signed)
Who is dentist who is pulling? Why do they need to stop warfarin  We need request first from them with information from dentist and see if dentist must do off warfarin or if routine request

## 2013-06-07 NOTE — Progress Notes (Signed)
Nurse navigator note - cardiology  [facility] - [facility]a [facility],     Call to pt who said she"as raggedy mouth, and she's sick of it. "She said she has some broken teeth, and a loose tooth -   She has not seen dentist yet - This will be coordinated with [facility].    Re: her Heart condition- she has been tired and "huffing and puffing". This happens with minimal activity. Ordinary things can bother her - she gets winded with minimal activity. She has not gotten her Digoxin yet - but it is forthcoming. ([facility] is in the [facility], which is all inclusive for her medical care);  She had some questions about the Digoxin - we discussed the reason for the medicine was prescribed, and that it is commonly used for irregular heart beat management.    Dr. Nyoka CowdenSabharwal's note - 05/30/13  START DIGOXIN 0.125 ONCE A DAY  FOLLOW-UP IN 2 WEEKS WITH AN ECHO    Plan:  NN LM for [name], [facility] - to coordinate care  Pt has return appt with cardiology 2/12 for echo and md follow up - [facility] would be the pre-auth  When dental provider is chosen, pt is seen, and plan in place - coordinate with cardiology re: Coumadin.    Note to provider- about this/ will assist with coordination.    Alyssa Hawkins has valve disease/ repair - history of a fib.  She denies edema.    Will mail information to pt re: a fib, heart valves, daily weights and contact information for [facility].    Alyssa Ariaseresa Vadis Slabach,RN  [facility]  [phone]  Letter, enclosures

## 2013-06-11 NOTE — Progress Notes (Signed)
Nurse navigator note - cardiology  [facility], Pieter PartridgeMiriam, re: pt's scheduled appt tomorrow with Dr. Gasper LloydSabharwal and echo -  Pieter PartridgeMiriam said that the patient did not convey this information to them -   ([facility] functioning as her provider - pcp and as insurance /authorization and coverage provider)  They just received notification from the patient about starting the Digoxin 0.125 mg every day.    This information had been reviewed with the patient at the visit/ and a letter was sent to her, as well.  [facility] to discuss [phone]/ [phone] - fax    They requested that we wait 2 weeks for follow up echo and appt -   They provide her transportation and they did not know about it.    Plan: Note to Dr. Gasper LloydSAbharwal to approve the delay in plan - MD follow up - Dig has not been started; and for echo.  Pending his response - will reschedule Encarnacion Slates-    TKile,RN    Last office visit consultation note and after visit summary [facility] [phone].  ttk

## 2013-06-12 NOTE — Telephone Encounter (Signed)
Nurse navigator note - cardiology  Call to [facility] [phone]- [phone] to reschedule appt - attempted to reach/ got voicemail  Pt needs echo and follow up with Dr. Gasper LloydSAbharwal - in 2 weeks -   Week of Feb 23rd - [facility] will need to authorize -   Will ask Angie - PSR - scheduler for Dr. Gasper LloydSabharwal to do this -     Encarnacion SlatesKile,RN

## 2013-06-14 NOTE — Telephone Encounter (Signed)
LVM with [name]([facility] Coordinator-# [phone])  this morning, informing that pt needs an Echo & to see Dr. Gasper LloydSabharwal within 2 weeks(week of February 23rd).Marland Kitchen.Marland Kitchen.Thanks Angie

## 2013-06-28 LAB — AMB EXT CREATININE: Creatinine, External: 0.89

## 2013-07-16 NOTE — Patient Instructions (Signed)
Follow up with Dr. Sabharwal in 3 months.

## 2013-07-16 NOTE — Progress Notes (Signed)
Alyssa Hawkins     Feb 07, 1941       Alyssa Hawkins K. Alyssa Lloyd MD, [facility]  Date of Visit-07/16/2013   PCP is Caron Presume, MD   [facility]  [facility]  HPI:  Alyssa Hawkins is a 73 y.o. female   The patient returns today noting pressure in the chest that happens a few times a week and gets better with burping or if she moves her chest around.  She had a nuclear stress test  May 24, 2011.  She walked 4 minutes, 43 seconds with an EF of 68% and normal perfusion.  She was also admitted to [facility] April 2014 with a normal stress test, elevated troponin which was isolated.  She was admitted in 2012 with rapid atrial fibrillation with PVCs, troponin of .58 and hypertension.  I had previously seen her in January 2013 and then saw her again May 30, 2013.  She has a history of mitral valve repair and prosthetic AVR in 2000 in [place].    On her last visit, we had started her on  Digoxin .125 mg daily.  She had a heart rate of 100 with a sinus rhythm at that time.      She returns today accompanied by her a member of the staff from [facility].  Her heart rate is 79 with sinus rhythm.  There is no significant change in the ST segments which are nonspecific flattening and there is borderline left atrial enlargement.      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  In terms or cardiac meds, Cozaar, Metoprolol, Digoxin, Coumadin and Spironolactone.      Lab work from the [facility] was done as a BMP on June 26, 2013 showing BUN 14, creatinine .89, calcium 9.7, dig level of 1.1.      Other than the palpitations, she has no other complaints beyond the chest tightness which appears to be not necessarily related to exertion and as stated gets better with burping and gets worse depending on what food she eats.      Assessment/Plan:       1. Elderly   female with a history of bioprosthetic valve now 14 years out and mitral valve repair.  An  echocardiogram done today shows significant regurgitation of the tricuspid and pulmonic valves, moderate to severe with reasonable function of the aortic valve and some mild to moderate regurgitation seen in the mitral valve.  There is no overt edema and she is not in heart failure but those are concerning findings given those valves.  It is not a point of needing surgery.    2. The chest discomfort she describes is atypical of coronary disease.  She has had a normal stress test but she has also had mild elevation in troponin.  She may well need a cath if she develops refractory pain.    3. At this time, I do not think repeating a stress test is of value.  If her pattern stays the same, then treatment with beta-blocker is appropriate.  It is not established that she has had known cardiovascular disease in the past.  4. Hypertension.    5. COPD (chronic obstructive pulmonary disease).    6. Mitral and aortic valve disease.  7. Paroxysmal atrial fibrillation on Warfarin for anticoagulation.  Appears to be in sinus rhythm.     Impression:   1. Tricuspid regurgitation    2. Mitral  and aortic valve disease    3. PAF (paroxysmal atrial fibrillation) (HCC)    4. HTN (hypertension)    5. Elevated troponin level    6. COPD (chronic obstructive pulmonary disease) (HCC)       Cardiac History:   Hx of MV repair and prosthetic AVR in 2000 in [place]    Admit 2012 with rapid atrial fibrillation with PVCs and troponin of 0.58 of unclear source, has HTN    ECHO  05-20-11= EF 55-60, moderate BAE, diastolic dysfunction. Mild-mod MR, prev repair, mild stenosis, Aortic valve mechanical prosthesis fx ok with AVA 1.9 cm2, mean of 11 mm Hg; TR was moderate to severe     NUKE 05-24-11= wal;ke 4'43" ef 68% normal perfusion    Admit [facility] April 2014 with normal stress test with isolated elevated tropoinin   EKG:  Sinus  Rhythm   -RSR(V1) -nondiagnostic.    -Left atrial enlargement.    -  Nonspecific T-abnormality.      ROS-except as noted above.. A  complete cardiac and respiratory are reviewed and negative except as above ; Resp-denies wheezing  or productive cough,. Const- No unusual weight loss or fever; Neuro-no recent seizure or CVA ; GI- No BRBPR, abdom pain, bloating ; GU- no  hematuria   see supplement sheet, initialed and to be scanned by staff  Past Medical History   Diagnosis Date   ??? PAF (paroxysmal atrial fibrillation)      admit 2012    ??? Mitral and aortic valve disease      in [place] in 2000 mechanical AVR and MV repair; see echo report-Dr Alyssa Hawkins   ??? Hypertension    ??? COPD (chronic obstructive pulmonary disease)    ??? S/P MVR (mitral valve replacement)    ??? Elevated troponin level      no known CAD, had trop to 0.59, admit 2012 wtih rapid afib, also at admit April 2014 at [facility]   ??? Tricuspid regurgitation       Social Hx= reports that she has been smoking.  She does not have any smokeless tobacco history on file. She reports that she drinks alcohol.     Exam and Labs:  BP 122/70    Pulse 79    Ht 5\' 6"  (1.676 m)    Wt 129 lb (58.514 kg)    BMI 20.83 kg/m2      Constitutional:  NAD, comfortable ,   Head: [place],AT. Eyes: No scleral icterus. Neck:  Neck supple. No JVD present.Throat: moist mucous membranes.  Chest: Effort normal & normal respiratory excursion .Neurological: alert, conversant and oriented . Skin: Skin is not cold. No obvious systemic rash noted. Not diaphoretic. No erythema. Psychiatric:  Grossly normal mood and affect.  Behavior appears normal. Extremities:  no clubbing or cyanosis. Abdomen: non distended    Lungs:breath sounds normal. No stridor. distress, wheezes or  Rales.  Heart:normal rate and regular rhythm, no gallops noted, systolic murmur 2/6 at 2nd left intercostal space and at 2nd right intercostal space, no JVD , PMI non displaced.    Edema: Edema is none.     Wt Readings from Last 3 Encounters:   07/16/13 129 lb (58.514 kg)   05/30/13 132 lb 9.6 oz (60.147 kg)   08/02/12 124 lb (56.246 kg)      BP Readings from Last 3  Encounters:   07/16/13 122/70   05/30/13 130/70   08/02/12 152/82        Current Outpatient Prescriptions  Medication Sig   ??? fluticasone-salmeterol (ADVAIR DISKUS) 250-50 mcg/dose diskus inhaler Take 1 puff by inhalation every twelve (12) hours.   ??? levETIRAcetam (KEPPRA) 500 mg tablet Take  by mouth two (2) times a day.   ??? omeprazole (PRILOSEC) 20 mg capsule Take 20 mg by mouth daily.   ??? losartan (COZAAR) 100 mg tablet Take 100 mg by mouth daily.   ??? melatonin 5 mg tab Take  by mouth.   ??? capsicum oleoresin 0.025 % topical cream Apply  to affected area three (3) times daily.   ??? acetaminophen (TYLENOL) 325 mg tablet Take  by mouth every four (4) hours as needed for Pain.   ??? metoprolol succinate (TOPROL XL) 100 mg XL tablet Take  by mouth daily.   ??? digoxin (DIGOX) 0.125 mg tablet Take 1 tablet by mouth daily.   ??? warfarin (COUMADIN) 2 mg tablet TAKE 1 TABLET BY MOUTH ONCE DAILY   ??? sertraline (ZOLOFT) 100 mg tablet Take 1 Tab by mouth daily.   ??? albuterol (VENTOLIN HFA) 90 mcg/actuation inhaler Take 2 Puffs by inhalation every six (6) hours as needed for Shortness of Breath. May use ProAir if cheaper   ??? traMADol (ULTRAM) 50 mg tablet Take 1 Tab by mouth every six (6) hours as needed for Pain.   ??? fluticasone (FLONASE) 50 mcg/actuation nasal spray 2 Sprays by Both Nostrils route daily.   ??? spironolactone (ALDACTONE) 25 mg tablet Take 25 mg by mouth daily.     No current facility-administered medications for this visit.      Impression see above.

## 2013-09-26 NOTE — Telephone Encounter (Signed)
Pt identified with two pt verifiers.  Pt's coumadin has been stopped for six days due to dental procedure. Please call [phone] pt has a mechanical heart valve. The question is can pt be bridged on Lovenox, how much so she may go back on coumadin.

## 2013-09-27 NOTE — Telephone Encounter (Signed)
Spoke to Dr Honor Loh  Another physician  took off warfarin  for dental work  She and I discussed and both feel that with mechanical even in aortic position , we should cover  This is my preference though [place] and [place] guidelines differ  Today Dr Honor Loh has already started lovenox to cover as bridge for heparin  Agree with that plan

## 2014-01-07 NOTE — Telephone Encounter (Signed)
Pt identified x 2. Dana from pharmacy needs to know if the patient has an aortic valve replacement as well as a mitral valve replacement, and are they both mechanical? She needs this information to properly dose the pt's coumadin.

## 2014-01-07 NOTE — Telephone Encounter (Signed)
Alyssa Hawkins from [facility] calling to get pt information. Please return call to [phone].

## 2014-01-09 NOTE — Telephone Encounter (Signed)
Pt has mechanical AVR and the MV has a repair not replacement

## 2014-01-09 NOTE — Telephone Encounter (Signed)
Ringing busy, will try again later.

## 2014-01-09 NOTE — Telephone Encounter (Signed)
This number has been tried multiple times since the last attempt, still ringing busy.

## 2014-01-09 NOTE — Telephone Encounter (Signed)
Alyssa Hawkins from [facility] calling to follow up on whether or not the pt. Has an aortic valve replacement as well as a mitral valve replacement, and wondering if they are both mechanical. Please call her @ [phone] Thanks, Alyssa Hawkins

## 2014-01-10 NOTE — Telephone Encounter (Addendum)
Pt identified x 2. Alyssa Hawkins was made aware the the pt has a mechanical AVR and the MV was only repaired not replaced. Understanding verbalized. The correct number is [phone].

## 2014-01-10 NOTE — Telephone Encounter (Signed)
Several attempts made this AM but phone ringing busy, will continue to try.

## 2014-02-05 ENCOUNTER — Encounter

## 2014-02-07 ENCOUNTER — Inpatient Hospital Stay: Admit: 2014-02-07 | Payer: MEDICARE | Primary: Emergency Medicine

## 2014-02-07 DIAGNOSIS — Z13820 Encounter for screening for osteoporosis: Secondary | ICD-10-CM

## 2014-02-10 ENCOUNTER — Encounter: Attending: Rheumatology | Primary: Emergency Medicine

## 2014-10-09 ENCOUNTER — Encounter: Payer: Self-pay | Admitting: Podiatry

## 2014-10-09 ENCOUNTER — Ambulatory Visit (INDEPENDENT_AMBULATORY_CARE_PROVIDER_SITE_OTHER): Payer: Medicare Other | Admitting: Podiatry

## 2014-10-09 VITALS — BP 179/100 | HR 73 | Temp 97.5°F | Resp 14

## 2014-10-09 DIAGNOSIS — Q828 Other specified congenital malformations of skin: Secondary | ICD-10-CM | POA: Diagnosis not present

## 2014-10-09 NOTE — Progress Notes (Signed)
   Subjective:    Patient ID: Mary Rojas, female    DOB: 1940/06/23, 74 y.o.   MRN: [id number]  HPI  This patient presents to the office for painful skin lesion on her left foot.  The painful lesion has been present for six months.  No history of trauma.  This lesion is painful walking and wearing her shoes.    Review of Systems     Objective:   Physical Exam Objective: Review of past medical history, medications, social history and allergies were performed.  Vascular: Dorsalis pedis and posterior tibial pulses were palpable B/L, capillary refill was  WNL B/L, temperature gradient was WNL B/L   Skin:  Well defined well circumscribed porokeratotic tissue medial asprvy left heel  Nails: appear healthy with no signs of mycosis or infections  Sensory: Semmes Weinstein monifilament WNL   Orthopedic: Orthopedic evaluation demonstrates all joints distal t ankle have full ROM without crepitus, muscle power WNL B/L        Assessment & Plan:  Porokeratosis left heel  Plan  IE  Debridement of lesion.  Discussed future treatment.

## 2014-12-23 ENCOUNTER — Encounter: Payer: Self-pay | Admitting: Podiatry

## 2014-12-23 ENCOUNTER — Ambulatory Visit (INDEPENDENT_AMBULATORY_CARE_PROVIDER_SITE_OTHER): Payer: Medicare Other | Admitting: Podiatry

## 2014-12-23 VITALS — BP 118/64 | HR 69 | Resp 12

## 2014-12-23 DIAGNOSIS — L570 Actinic keratosis: Secondary | ICD-10-CM

## 2014-12-23 DIAGNOSIS — B079 Viral wart, unspecified: Secondary | ICD-10-CM

## 2014-12-23 DIAGNOSIS — Q828 Other specified congenital malformations of skin: Secondary | ICD-10-CM | POA: Diagnosis not present

## 2014-12-23 DIAGNOSIS — B078 Other viral warts: Secondary | ICD-10-CM

## 2014-12-23 NOTE — Progress Notes (Addendum)
Subjective:     Patient ID: Mary Rojas, female   DOB: July 07, 1940, 74 y.o.   MRN: [id number]  HPIThis patient presents to office with her callus on her right inside heel sending sharp shooting pains through her foot.  She says the callus initially was not painful over 6 months ago.  Since then I debrided the lesion.  As the lesion developed pain started to become more frequent causing her to have difficulty walking.  She presents for evaluation and treatment.   Review of Systems     Objective:   Physical Exam GENERAL APPEARANCE: Alert, conversant. Appropriately groomed. No acute distress.  VASCULAR: Pedal pulses palpable at  [facility] and PT bilateral.  Capillary refill time is immediate to all digits,  Normal temperature gradient.  Digital hair growth is present bilateral  NEUROLOGIC: sensation is normal to 5.07 monofilament at 5/5 sites bilateral.  Light touch is intact bilateral, Muscle strength normal.  MUSCULOSKELETAL: acceptable muscle strength, tone and stability bilateral.  Intrinsic muscluature intact bilateral.  Rectus appearance of foot and digits noted bilateral.   DERMATOLOGIC: painful well circumscribed skin lesion with white area in center of callus medial side left heel.  No signs of redness or swelling or infection.  No evidence of cauliflower tissue noted.     Assessment:     Porokeratosis left foot.     Plan:     ROV  Excision of porokeratosis left foot. This patient was anesthetized with mixture of 2% lidocaine plain and 2 % lidocaine with epi. at the site of the skin lesion.  The surgical site was then washed with betadine and alcohol.  Using a  # 15 blade the lesion was excised and passed off as specimen.    The site was bandaged with neosporin, sterile 2x2 and kling.  Home instructions given. RTC 1 week. Specimen sent to pathology.

## 2014-12-31 ENCOUNTER — Ambulatory Visit (INDEPENDENT_AMBULATORY_CARE_PROVIDER_SITE_OTHER): Payer: Medicare Other | Admitting: Podiatry

## 2014-12-31 ENCOUNTER — Encounter: Payer: Self-pay | Admitting: Podiatry

## 2014-12-31 VITALS — BP 151/85 | HR 75 | Resp 16

## 2014-12-31 DIAGNOSIS — M722 Plantar fascial fibromatosis: Secondary | ICD-10-CM | POA: Diagnosis not present

## 2014-12-31 DIAGNOSIS — Q828 Other specified congenital malformations of skin: Secondary | ICD-10-CM

## 2014-12-31 NOTE — Progress Notes (Signed)
Subjective:     Patient ID: Mary Rojas, female   DOB: 1940-09-01, 74 y.o.   MRN: [id number]  HPIThis patient presents to the office following removal of skin lesion left heel.  She says the surgical site is causing no pain and she has been soaking and bandaging her surgical site.  She has developed heel pain which is 98 ouyt of 10 and painful in AM and walking.     Review of Systems     Objective:   Physical Exam HPI    Podiatric Exam: Vascular: dorsalis pedis and posterior tibial pulses  Are palpable. Capillary return is diminished.  Sensorium: Normal Semmes Weinstein monofilament test. Normal tactile sensation bilaterally.  Nail Exam: Pt has thick disfigured discolored nails with subungual debris noted bilateral entire nail hallux through fifth toenails Ulcer Exam: There is no evidence of ulcer or pre-ulcerative changes or infection. Orthopedic Exam: Muscle tone and strength are WNL. No limitations in general ROM. No crepitus or effusions noted. Foot type and digits show no abnormalities. Bony prominences are unremarkable.Palpable pain at insertion plantar fascia left foot  Skin  Red healthy granular tissue noted medial aspect left heel.  No signs of redness or swelling noted.        Assessment:     S/p skin surgery left foot    Plantar fascitis left foot.     Plan:     ROV  Injection therapy left heel.  RTC 3  Weeks.  Continue home soaks. Pathology report has not been received.

## 2015-01-22 ENCOUNTER — Ambulatory Visit: Payer: Medicare Other | Admitting: Podiatry

## 2015-01-29 ENCOUNTER — Ambulatory Visit (INDEPENDENT_AMBULATORY_CARE_PROVIDER_SITE_OTHER): Payer: Medicare Other | Admitting: Podiatry

## 2015-01-29 ENCOUNTER — Encounter: Payer: Self-pay | Admitting: Podiatry

## 2015-01-29 VITALS — BP 156/84 | HR 92 | Resp 12

## 2015-01-29 DIAGNOSIS — M722 Plantar fascial fibromatosis: Secondary | ICD-10-CM | POA: Diagnosis not present

## 2015-01-29 DIAGNOSIS — Q828 Other specified congenital malformations of skin: Secondary | ICD-10-CM

## 2015-01-29 NOTE — Progress Notes (Signed)
Subjective:     Patient ID: Mary Rojas, female   DOB: [birth year], 74 y.o.   MRN: [id number]  HPIThis patient presents to the office following removal of skin lesion left heel.  She says the surgical site is causing no pain and she has been soaking and bandaging her surgical site.  She says her heel did great for over 2 weeks since her last visit and injection.  She says the pain seems to be returning..     Review of Systems     Objective:   Physical Exam HPI    Podiatric Exam: Vascular: dorsalis pedis and posterior tibial pulses  are palpable. Capillary return is diminished.  Sensorium: Normal Semmes Weinstein monofilament test. Normal tactile sensation bilaterally.  Nail Exam: Pt has thick disfigured discolored nails with subungual debris noted bilateral entire nail hallux through fifth toenails Ulcer Exam: There is no evidence of ulcer or pre-ulcerative changes or infection. Orthopedic Exam: Muscle tone and strength are WNL. No limitations in general ROM. No crepitus or effusions noted. Foot type and digits show no abnormalities. Bony prominences are unremarkable.Palpable pain at insertion plantar fascia left foot  Skin Skin has healed with callus skin over surgical site.  No redness or swelling noted.        Assessment:     S/p skin surgery left foot    Plantar fascitis left foot.     Plan:     ROV  Injection therapy left heel.  RTC 3  Weeks.  Told her to discontinue soaks.  Told her the pathology report has comre back.  They recommended excisional biopsy if problem occurs with the healing.Marland Kitchen

## 2015-03-30 ENCOUNTER — Encounter (HOSPITAL_COMMUNITY): Payer: Self-pay | Admitting: Emergency Medicine

## 2015-03-30 ENCOUNTER — Emergency Department (HOSPITAL_COMMUNITY)
Admission: EM | Admit: 2015-03-30 | Discharge: 2015-03-31 | Disposition: A | Discharge status: Home / Self Care | Payer: Medicare Other | Attending: Emergency Medicine | Admitting: Emergency Medicine

## 2015-03-30 ENCOUNTER — Emergency Department (HOSPITAL_COMMUNITY): Payer: Medicare Other

## 2015-03-30 DIAGNOSIS — K529 Noninfective gastroenteritis and colitis, unspecified: Secondary | ICD-10-CM | POA: Insufficient documentation

## 2015-03-30 DIAGNOSIS — Z8739 Personal history of other diseases of the musculoskeletal system and connective tissue: Secondary | ICD-10-CM | POA: Diagnosis not present

## 2015-03-30 DIAGNOSIS — Z9851 Tubal ligation status: Secondary | ICD-10-CM | POA: Diagnosis not present

## 2015-03-30 DIAGNOSIS — Z9049 Acquired absence of other specified parts of digestive tract: Secondary | ICD-10-CM | POA: Diagnosis not present

## 2015-03-30 DIAGNOSIS — Z7951 Long term (current) use of inhaled steroids: Secondary | ICD-10-CM | POA: Diagnosis not present

## 2015-03-30 DIAGNOSIS — I10 Essential (primary) hypertension: Secondary | ICD-10-CM | POA: Diagnosis not present

## 2015-03-30 DIAGNOSIS — Z79899 Other long term (current) drug therapy: Secondary | ICD-10-CM | POA: Insufficient documentation

## 2015-03-30 DIAGNOSIS — J449 Chronic obstructive pulmonary disease, unspecified: Secondary | ICD-10-CM | POA: Diagnosis not present

## 2015-03-30 DIAGNOSIS — Z7901 Long term (current) use of anticoagulants: Secondary | ICD-10-CM | POA: Insufficient documentation

## 2015-03-30 DIAGNOSIS — Z880 Allergy status to penicillin: Secondary | ICD-10-CM | POA: Insufficient documentation

## 2015-03-30 DIAGNOSIS — F17210 Nicotine dependence, cigarettes, uncomplicated: Secondary | ICD-10-CM | POA: Insufficient documentation

## 2015-03-30 DIAGNOSIS — R109 Unspecified abdominal pain: Secondary | ICD-10-CM | POA: Diagnosis present

## 2015-03-30 HISTORY — DX: Chronic obstructive pulmonary disease, unspecified: J44.9

## 2015-03-30 LAB — COMPREHENSIVE METABOLIC PANEL
ALBUMIN: 4 g/dL (ref 3.5–5.0)
ALK PHOS: 75 U/L (ref 38–126)
ALT: 13 U/L — ABNORMAL LOW (ref 14–54)
ANION GAP: 11 (ref 5–15)
AST: 27 U/L (ref 15–41)
BILIRUBIN TOTAL: 0.6 mg/dL (ref 0.3–1.2)
BUN: 9 mg/dL (ref 6–20)
CALCIUM: 9.3 mg/dL (ref 8.9–10.3)
CO2: 27 mmol/L (ref 22–32)
Chloride: 101 mmol/L (ref 101–111)
Creatinine, Ser: 0.92 mg/dL (ref 0.44–1.00)
GFR calc Af Amer: >60 mL/min (ref >60)
GFR, EST NON AFRICAN AMERICAN: 60 mL/min — AB (ref >60)
GLUCOSE: 118 mg/dL — AB (ref 65–99)
POTASSIUM: 3.6 mmol/L (ref 3.5–5.1)
Sodium: 139 mmol/L (ref 135–145)
TOTAL PROTEIN: 8.6 g/dL — AB (ref 6.5–8.1)

## 2015-03-30 LAB — URINALYSIS, ROUTINE W REFLEX MICROSCOPIC
BILIRUBIN URINE: NEGATIVE
Glucose, UA: NEGATIVE mg/dL
Hgb urine dipstick: NEGATIVE
KETONES UR: NEGATIVE mg/dL
LEUKOCYTES UA: NEGATIVE
NITRITE: NEGATIVE
Protein, ur: NEGATIVE mg/dL
SPECIFIC GRAVITY, URINE: 1.006 (ref 1.005–1.030)
pH: 7 (ref 5.0–8.0)

## 2015-03-30 LAB — CBC
HCT: 42.7 % (ref 36.0–46.0)
HEMOGLOBIN: 13.8 g/dL (ref 12.0–15.0)
MCH: 27.1 pg (ref 26.0–34.0)
MCHC: 32.3 g/dL (ref 30.0–36.0)
MCV: 83.7 fL (ref 78.0–100.0)
Platelets: 190 10*3/uL (ref 150–400)
RBC: 5.1 MIL/uL (ref 3.87–5.11)
RDW: 15.3 % (ref 11.5–15.5)
WBC: 4.6 10*3/uL (ref 4.0–10.5)

## 2015-03-30 LAB — LIPASE, BLOOD: Lipase: 84 U/L — ABNORMAL HIGH (ref 11–51)

## 2015-03-30 MED ORDER — SODIUM CHLORIDE 0.9 % IV BOLUS (SEPSIS)
1000.0000 mL | Freq: Once | INTRAVENOUS | Status: AC
  Administered 2015-03-30: 1000 mL via INTRAVENOUS

## 2015-03-30 MED ORDER — ONDANSETRON HCL 4 MG/2ML IJ SOLN
4.0000 mg | Freq: Once | INTRAMUSCULAR | Status: AC
  Administered 2015-03-30: 4 mg via INTRAVENOUS
  Filled 2015-03-30: qty 2

## 2015-03-30 MED ORDER — IOHEXOL 300 MG/ML  SOLN
75.0000 mL | Freq: Once | INTRAMUSCULAR | Status: AC | PRN
  Administered 2015-03-30: 75 mL via INTRAVENOUS

## 2015-03-30 NOTE — ED Notes (Signed)
Per daughter, mother hasn't taken her medication since Saturday 03-28-15.  Due to patient hasn't been feeling good.

## 2015-03-30 NOTE — ED Notes (Signed)
Several attempts to start IV in ER, no success.  Two other nurses attempted.  IV team consult placed

## 2015-03-30 NOTE — ED Provider Notes (Signed)
CSN: [id number]     Arrival date & time 03/30/15  1651 History   First MD Initiated Contact with Patient 03/30/15 2019     Chief Complaint  Patient presents with  . Abdominal Pain  . Emesis     (Consider location/radiation/quality/duration/timing/severity/associated sxs/prior Treatment) HPI Ranetta Armacost is a 74 y/o AAF with a CC of abdominal pain and emesis that began two days ago. She has had four episodes of emesis with her most recent being 2am this morning.  Vomit is NBNB.  Abdominal pain became more prominent yesterday and is described as diffuse, non-radiating, sharp, and shooting.  Pain has been a 4/10 but was a 7/10 on arrival.  Her daughter has been giving her lemon ginger water which has provided some relief.  Denies fever, chills, dyspnea, chest pain, diarrhea, dizziness, headache, blurred vision, back pain, neck pain, lightheadedness, near syncope or syncope or constipation. She denies recent sick contacts.   Past Medical History  Diagnosis Date  . Hypertension   . Seizures (HCC)   . Mitral valve problem   . Lupus (HCC)   . COPD (chronic obstructive pulmonary disease) [facility])    Past Surgical History  Procedure Laterality Date  . Cholecystectomy      2008  . Tubal ligation     No family history on file. Social History  Substance Use Topics  . Smoking status: Current Every Day Smoker -- 1.00 packs/day    Types: Cigarettes  . Smokeless tobacco: Never Used  . Alcohol Use: 0.0 oz/week    0 Standard drinks or equivalent per week     Comment: 4 drinks weekly   OB History    No data available     Review of Systems All other systems negative except as documented in the HPI. All pertinent positives and negatives as reviewed in the HPI.    Allergies  Penicillins  Home Medications   Prior to Admission medications   Medication Sig Start Date End Date Taking? Authorizing Provider  digoxin (LANOXIN) 0.125 MG tablet Take 0.125 mg by mouth daily.   Yes Historical  Provider, MD  donepezil (ARICEPT) 10 MG tablet Take 10 mg by mouth at bedtime.   Yes Historical Provider, MD  Fluticasone-Salmeterol (ADVAIR) 250-50 MCG/DOSE AEPB Inhale 1 puff into the lungs every 12 (twelve) hours.   Yes Historical Provider, MD  levETIRAcetam (KEPPRA) 500 MG tablet Take 500 mg by mouth daily.   Yes Historical Provider, MD  losartan (COZAAR) 100 MG tablet Take 100 mg by mouth daily.   Yes Historical Provider, MD  metoprolol succinate (TOPROL-XL) 100 MG 24 hr tablet Take 100 mg by mouth daily. Take with or immediately following a meal.   Yes Historical Provider, MD  sertraline (ZOLOFT) 100 MG tablet Take 150 mg by mouth daily.   Yes Historical Provider, MD  warfarin (COUMADIN) 1 MG tablet Take 1-2 mg by mouth daily. Take 2 tablet (2 mg) on Sun, Tues, Thurs and Take 1 tablet (1 mg) the remaining days of the week.   Yes Historical Provider, MD   BP 191/93 mmHg  Pulse 65  Temp(Src) 97.9 F (36.6 C) (Oral)  Resp 16  Ht  (1.702 m)  Wt 48.988 kg  BMI 16.91 kg/m2  SpO2 96%  LMP  (LMP Unknown) Physical Exam  Constitutional: She is oriented to person, place, and time. She appears well-developed and well-nourished. No distress.  HENT:  Head: Normocephalic and atraumatic.  Mouth/Throat: Oropharynx is clear and moist.  Eyes: Pupils  are equal, round, and reactive to light.  Neck: Normal range of motion. Neck supple.  Cardiovascular: Normal rate, regular rhythm and normal heart sounds.  Exam reveals no gallop and no friction rub.   No murmur heard. Pulmonary/Chest: Effort normal and breath sounds normal. No respiratory distress. She has no wheezes.  Abdominal: Soft. Bowel sounds are normal. She exhibits no distension. There is generalized tenderness. There is no rebound.  Neurological: She is alert and oriented to person, place, and time. She exhibits normal muscle tone. Coordination normal.  Skin: Skin is warm and dry. No rash noted. No erythema.  Psychiatric: She has a  normal mood and affect. Her behavior is normal.  Nursing note and vitals reviewed.   ED Course  Procedures (including critical care time) Labs Review Labs Reviewed  LIPASE, BLOOD - Abnormal; Notable for the following:    Lipase 84 (*)    All other components within normal limits  COMPREHENSIVE METABOLIC PANEL - Abnormal; Notable for the following:    Glucose, Bld 118 (*)    Total Protein 8.6 (*)    ALT 13 (*)    GFR calc non Af Amer 60 (*)    All other components within normal limits  CBC  URINALYSIS, ROUTINE W REFLEX MICROSCOPIC (NOT AT [facility])    Imaging Review No results found. I have personally reviewed and evaluated these images and lab results as part of my medical decision-making.   EKG Interpretation None      Assessment & Plan  - Elevated lipase; perform abdominal CT to assess for pancreatitis or other GI pathology    Patient is feeling better at this time.  She tolerated oral fluids.  Patient will be discharged home.  She has follow-up with her primary doctor in 2 days.  Told to return here as needed.  Patient agrees the plan and all questions were answered.  This is most likely a viral gastroenteritis  Charlestine NightChristopher Tage Feggins, PA-C 03/31/15 0041  Pricilla LovelessScott Goldston, MD 04/02/15 [phone]

## 2015-03-30 NOTE — ED Notes (Signed)
Patient states that she began to have emesis on Saturday. She has had multiple episodes of nausea/vomiting since Saturday. Has not vomited today, but has also not eaten anything.  Complaining of mid abdominal pain starting today. One episode of diarrhea today.

## 2015-03-31 MED ORDER — PROMETHAZINE HCL 25 MG PO TABS: 25.0000 mg | ORAL_TABLET | Freq: Three times a day (TID) | ORAL | Status: DC | PRN

## 2015-03-31 NOTE — Discharge Instructions (Signed)
Return here for any worsening in your condition.  Follow-up with your GI doctor and primary care doctor.  Increase your fluid intake.     Food Choices to Help Relieve Diarrhea, Adult When you have diarrhea, the foods you eat and your eating habits are very important. Choosing the right foods and drinks can help relieve diarrhea. Also, because diarrhea can last up to 7 days, you need to replace lost fluids and electrolytes (such as sodium, potassium, and chloride) in order to help prevent dehydration.  WHAT GENERAL GUIDELINES DO I NEED TO FOLLOW?  Slowly drink 1 cup (8 oz) of fluid for each episode of diarrhea. If you are getting enough fluid, your urine will be clear or pale yellow.  Eat starchy foods. Some good choices include white rice, white toast, pasta, low-fiber cereal, baked potatoes (without the skin), saltine crackers, and bagels.  Avoid large servings of any cooked vegetables.  Limit fruit to two servings per day. A serving is  cup or 1 small piece.  Choose foods with less than 2 g of fiber per serving.  Limit fats to less than 8 tsp (38 g) per day.  Avoid fried foods.  Eat foods that have probiotics in them. Probiotics can be found in certain dairy products.  Avoid foods and beverages that may increase the speed at which food moves through the stomach and intestines (gastrointestinal tract). Things to avoid include:  High-fiber foods, such as dried fruit, raw fruits and vegetables, nuts, seeds, and whole grain foods.  Spicy foods and high-fat foods.  Foods and beverages sweetened with high-fructose corn syrup, honey, or sugar alcohols such as xylitol, sorbitol, and mannitol. WHAT FOODS ARE RECOMMENDED? Grains White rice. White, [place], or pita breads (fresh or toasted), including plain rolls, buns, or bagels. White pasta. Saltine, soda, or graham crackers. Pretzels. Low-fiber cereal. Cooked cereals made with water (such as cornmeal, farina, or cream cereals). Plain  muffins. Matzo. Melba toast. Zwieback.  Vegetables Potatoes (without the skin). Strained tomato and vegetable juices. Most well-cooked and canned vegetables without seeds. Tender lettuce. Fruits Cooked or canned applesauce, apricots, cherries, fruit cocktail, grapefruit, peaches, pears, or plums. Fresh bananas, apples without skin, cherries, grapes, cantaloupe, grapefruit, peaches, oranges, or plums.  Meat and Other Protein Products Baked or boiled chicken. Eggs. Tofu. Fish. Seafood. Smooth peanut butter. Ground or well-cooked tender beef, ham, veal, lamb, pork, or poultry.  Dairy Plain yogurt, kefir, and unsweetened liquid yogurt. Lactose-free milk, buttermilk, or soy milk. Plain hard cheese. Beverages Sport drinks. Clear broths. Diluted fruit juices (except prune). Regular, caffeine-free sodas such as ginger ale. Water. Decaffeinated teas. Oral rehydration solutions. Sugar-free beverages not sweetened with sugar alcohols. Other Bouillon, broth, or soups made from recommended foods.  The items listed above may not be a complete list of recommended foods or beverages. Contact your dietitian for more options. WHAT FOODS ARE NOT RECOMMENDED? Grains Whole grain, whole wheat, bran, or rye breads, rolls, pastas, crackers, and cereals. Wild or brown rice. Cereals that contain more than 2 g of fiber per serving. Corn tortillas or taco shells. Cooked or dry oatmeal. Granola. Popcorn. Vegetables Raw vegetables. Cabbage, broccoli, Brussels sprouts, artichokes, baked beans, beet greens, corn, kale, legumes, peas, sweet potatoes, and yams. Potato skins. Cooked spinach and cabbage. Fruits Dried fruit, including raisins and dates. Raw fruits. Stewed or dried prunes. Fresh apples with skin, apricots, mangoes, pears, raspberries, and strawberries.  Meat and Other Protein Products Chunky peanut butter. Nuts and seeds. Beans and lentils. Tomasa BlaseBacon.  Dairy High-fat cheeses. Milk, chocolate milk, and beverages made  with milk, such as milk shakes. Cream. Ice cream. Sweets and Desserts Sweet rolls, doughnuts, and sweet breads. Pancakes and waffles. Fats and Oils Butter. Cream sauces. Margarine. Salad oils. Plain salad dressings. Olives. Avocados.  Beverages Caffeinated beverages (such as coffee, tea, soda, or energy drinks). Alcoholic beverages. Fruit juices with pulp. Prune juice. Soft drinks sweetened with high-fructose corn syrup or sugar alcohols. Other Coconut. Hot sauce. Chili powder. Mayonnaise. Gravy. Cream-based or milk-based soups.  The items listed above may not be a complete list of foods and beverages to avoid. Contact your dietitian for more information. WHAT SHOULD I DO IF I BECOME DEHYDRATED? Diarrhea can sometimes lead to dehydration. Signs of dehydration include dark urine and dry mouth and skin. If you think you are dehydrated, you should rehydrate with an oral rehydration solution. These solutions can be purchased at pharmacies, retail stores, or online.  Drink -1 cup (120-240 mL) of oral rehydration solution each time you have an episode of diarrhea. If drinking this amount makes your diarrhea worse, try drinking smaller amounts more often. For example, drink 1-3 tsp (5-15 mL) every 5-10 minutes.  A general rule for staying hydrated is to drink 1-2 L of fluid per day. Talk to your health care provider about the specific amount you should be drinking each day. Drink enough fluids to keep your urine clear or pale yellow.   This information is not intended to replace advice given to you by your health care provider. Make sure you discuss any questions you have with your health care provider.   Document Released: 07/09/2003 Document Revised: 05/09/2014 Document Reviewed: 03/11/2013 Elsevier Interactive Patient Education [employer]

## 2015-04-28 ENCOUNTER — Other Ambulatory Visit: Payer: Self-pay | Admitting: Family Medicine

## 2015-04-28 DIAGNOSIS — R61 Generalized hyperhidrosis: Secondary | ICD-10-CM

## 2015-04-28 DIAGNOSIS — R634 Abnormal weight loss: Secondary | ICD-10-CM

## 2015-05-06 ENCOUNTER — Inpatient Hospital Stay: Admission: RE | Admit: 2015-05-06 | Payer: Self-pay | Source: Ambulatory Visit

## 2015-05-06 ENCOUNTER — Other Ambulatory Visit: Payer: Self-pay | Admitting: Family Medicine

## 2015-05-06 ENCOUNTER — Ambulatory Visit
Admission: RE | Admit: 2015-05-06 | Discharge: 2015-05-06 | Disposition: A | Discharge status: Home / Self Care | Payer: No Typology Code available for payment source | Source: Ambulatory Visit | Attending: Family Medicine | Admitting: Family Medicine

## 2015-05-06 ENCOUNTER — Ambulatory Visit: Admission: RE | Admit: 2015-05-06 | Payer: Self-pay | Source: Ambulatory Visit

## 2015-05-06 ENCOUNTER — Ambulatory Visit
Admission: RE | Admit: 2015-05-06 | Discharge: 2015-05-06 | Disposition: A | Discharge status: Home / Self Care | Payer: No Typology Code available for payment source | Source: Ambulatory Visit

## 2015-05-06 DIAGNOSIS — R61 Generalized hyperhidrosis: Secondary | ICD-10-CM

## 2015-05-06 DIAGNOSIS — R634 Abnormal weight loss: Secondary | ICD-10-CM

## 2015-05-06 MED ORDER — IOPAMIDOL (ISOVUE-300) INJECTION 61%
75.0000 mL | Freq: Once | INTRAVENOUS | Status: AC | PRN
  Administered 2015-05-06: 75 mL via INTRAVENOUS

## 2015-06-12 ENCOUNTER — Encounter: Payer: Self-pay | Admitting: Podiatry

## 2015-06-12 ENCOUNTER — Ambulatory Visit (INDEPENDENT_AMBULATORY_CARE_PROVIDER_SITE_OTHER): Payer: Medicare (Managed Care) | Admitting: Podiatry

## 2015-06-12 DIAGNOSIS — Q828 Other specified congenital malformations of skin: Secondary | ICD-10-CM | POA: Diagnosis not present

## 2015-06-12 DIAGNOSIS — M722 Plantar fascial fibromatosis: Secondary | ICD-10-CM

## 2015-06-12 NOTE — Progress Notes (Signed)
Subjective:     Patient ID: Mary Rojas, female   DOB: January 06, 1941, 75 y.o.   MRN: [id number]  HPIThis patient presents to the office with pain that has returned to her left foot.  She says her left heel pain is not all the time.  Her skin lesion has returned since the lesion was previously excised.  The pathology report reported high return frequency.  She returns for evaluation and treatment.   Review of Systems     Objective:   Physical Exam GENERAL APPEARANCE: Alert, conversant. Appropriately groomed. No acute distress.  VASCULAR: Pedal pulses palpable at  [facility] and PT bilateral.  Capillary refill time is immediate to all digits,  Normal temperature gradient.  Digital hair growth is present bilateral  NEUROLOGIC: sensation is normal to 5.07 monofilament at 5/5 sites bilateral.  Light touch is intact bilateral, Muscle strength normal.  MUSCULOSKELETAL: acceptable muscle strength, tone and stability bilateral.  Intrinsic muscluature intact bilateral.  Rectus appearance of foot and digits noted bilateral. Palpable pain at insertion plantar fascia left foot.  DERMATOLOGIC: skin color, texture, and turgor are within normal limits.  No preulcerative lesions or ulcers  are seen, no interdigital maceration noted.  No open lesions present.  Digital nails are asymptomatic. No drainage noted. Well circumscribed lesion medial aspect left heel.      Assessment:     Plantar fascitis Left  Skin lesion left foot.     Plan:     Injection therapy left foot at insertion plantar fascia.  Debrided lesion.  Told patient the path report was examined and no evidence cancer noted.  There was hight rate of return noted.  Debride lesion.  Told her I would prefer treating her with conservative care.   Helane Gunther DPM

## 2015-10-14 ENCOUNTER — Ambulatory Visit
Admission: RE | Admit: 2015-10-14 | Discharge: 2015-10-14 | Disposition: A | Discharge status: Home / Self Care | Payer: No Typology Code available for payment source | Source: Ambulatory Visit | Attending: Internal Medicine | Admitting: Internal Medicine

## 2015-10-14 ENCOUNTER — Other Ambulatory Visit: Payer: Self-pay | Admitting: Internal Medicine

## 2015-10-14 DIAGNOSIS — R059 Cough, unspecified: Secondary | ICD-10-CM

## 2015-10-14 DIAGNOSIS — R05 Cough: Secondary | ICD-10-CM

## 2015-10-20 ENCOUNTER — Other Ambulatory Visit: Payer: Self-pay | Admitting: Internal Medicine

## 2015-10-20 DIAGNOSIS — M542 Cervicalgia: Secondary | ICD-10-CM

## 2015-10-20 DIAGNOSIS — M25511 Pain in right shoulder: Secondary | ICD-10-CM

## 2015-10-21 ENCOUNTER — Ambulatory Visit
Admission: RE | Admit: 2015-10-21 | Discharge: 2015-10-21 | Disposition: A | Discharge status: Home / Self Care | Payer: No Typology Code available for payment source | Source: Ambulatory Visit | Attending: Family Medicine | Admitting: Family Medicine

## 2015-10-21 ENCOUNTER — Other Ambulatory Visit: Payer: Self-pay | Admitting: Family Medicine

## 2015-10-21 ENCOUNTER — Ambulatory Visit
Admission: RE | Admit: 2015-10-21 | Discharge: 2015-10-21 | Disposition: A | Discharge status: Home / Self Care | Payer: No Typology Code available for payment source | Source: Ambulatory Visit | Attending: Internal Medicine | Admitting: Internal Medicine

## 2015-10-21 DIAGNOSIS — M25511 Pain in right shoulder: Secondary | ICD-10-CM

## 2015-10-21 DIAGNOSIS — M542 Cervicalgia: Secondary | ICD-10-CM

## 2016-09-26 IMAGING — CT CT CHEST W/ CM
3 of 5 series · 16 of 36 positions shown, 18 images · IV contrast (APPLIED)
Comparison: CT chest 05/06/2015

CLINICAL DATA: Cough, weight loss, lung nodules

EXAM:
CT CHEST WITH CONTRAST
TECHNIQUE: Multidetector CT imaging of the chest was performed during
intravenous contrast administration.
CONTRAST:  75 cc Isovue

[Series 2: chest w/cm · axial · 0.58mm/px · z∈[+866,+1096]mm · 8 of 149 slices shown, 10 images]
[im 17/149  mediastinal]
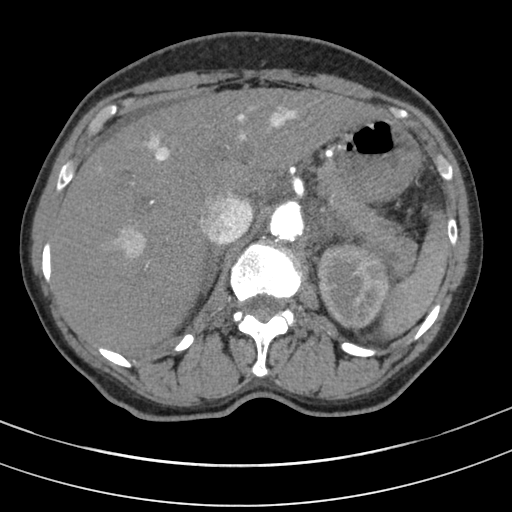
[im 17/149  lung]
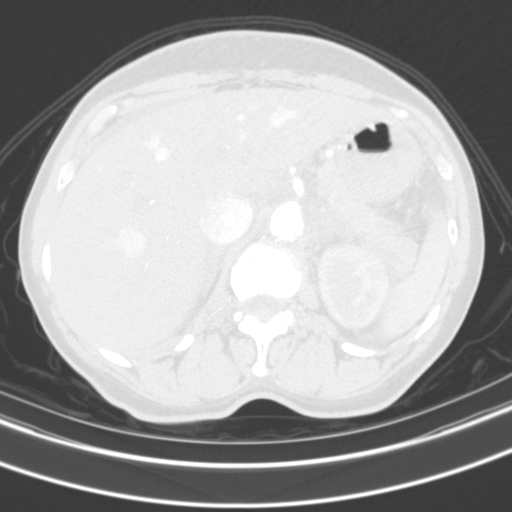
[im 33/149  lung]
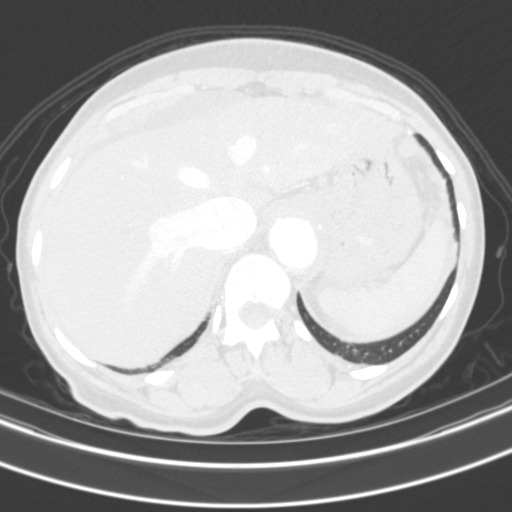
[im 50/149  lung]
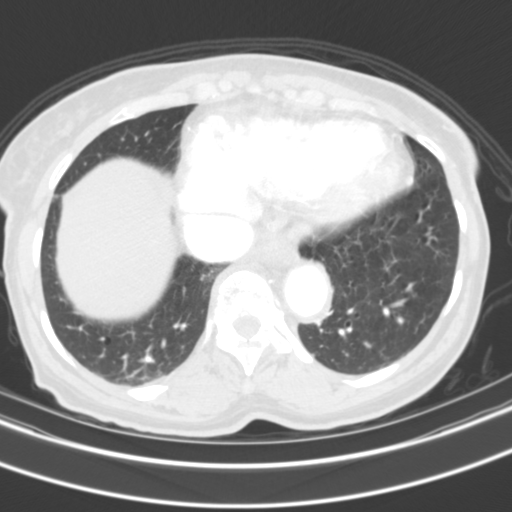
[im 66/149  lung]
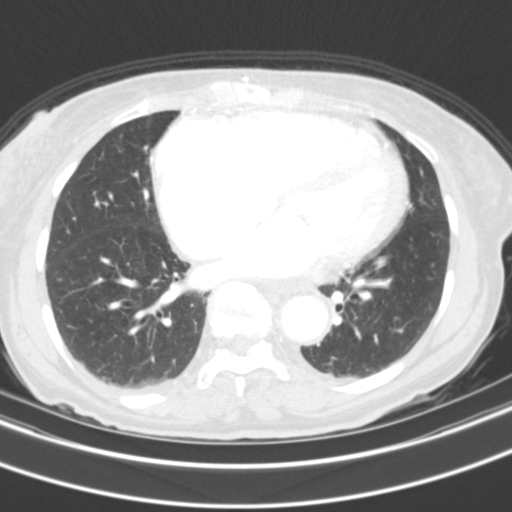
[im 83/149  mediastinal]
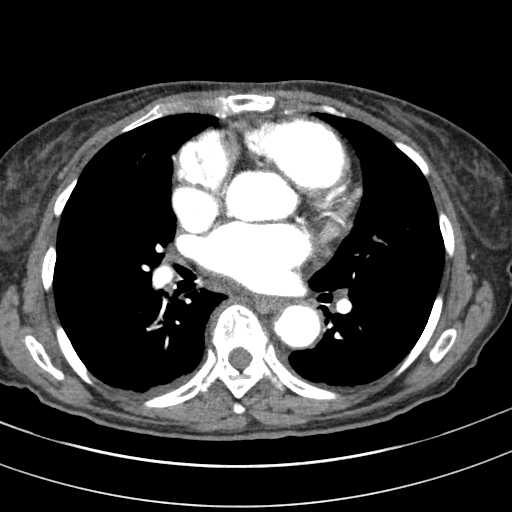
[im 83/149  lung]
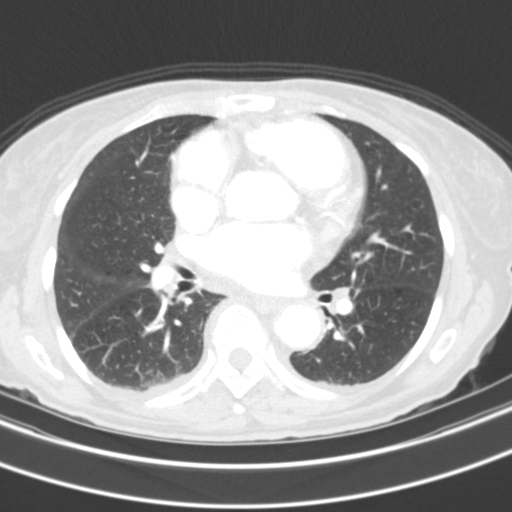
[im 99/149  lung]
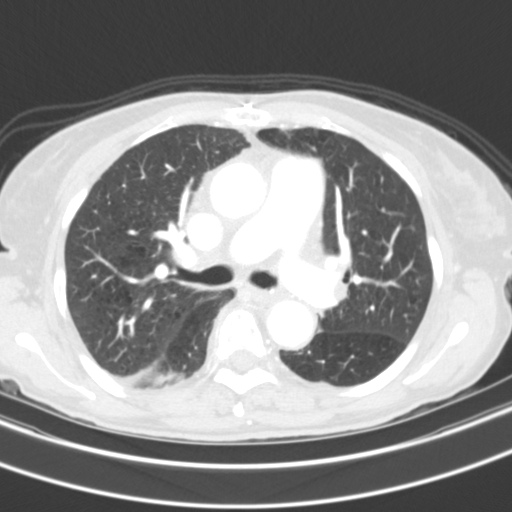
[im 116/149  lung]
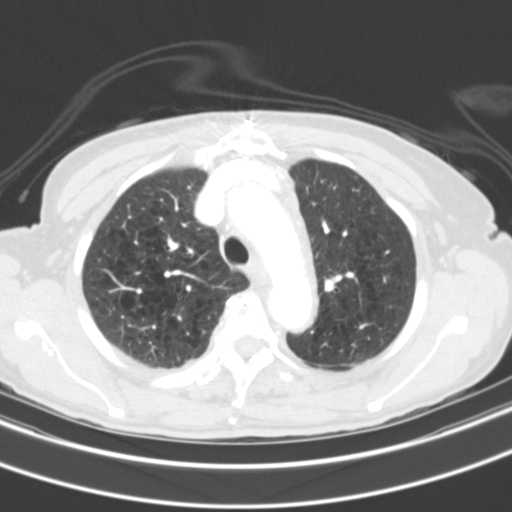
[im 132/149  lung]
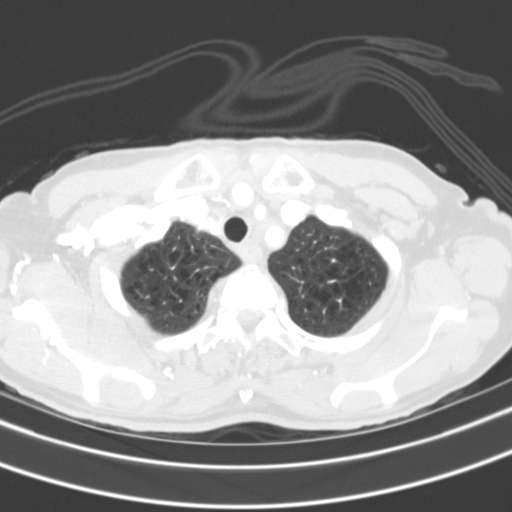

[Series 3: cor · coronal · 0.59mm/px · 3 of 111 slices shown]
[im 23/111  lung]
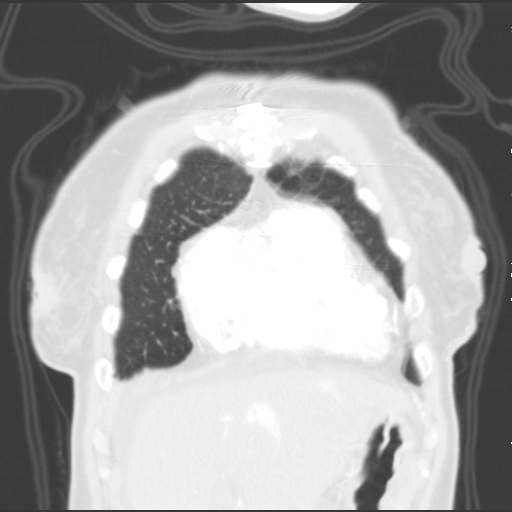
[im 45/111  lung]
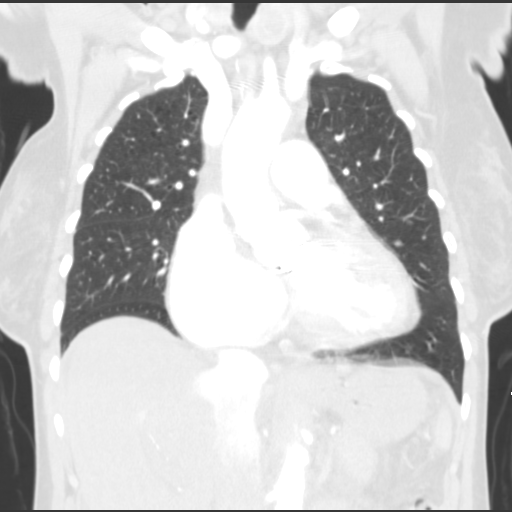
[im 67/111  lung]
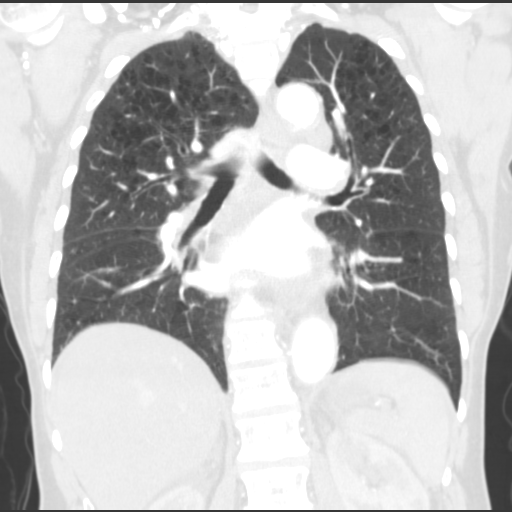

[Series 6: super d · axial · 0.58mm/px · z∈[+866,+998]mm · 5 of 149 slices shown]
[im 17/149  lung]
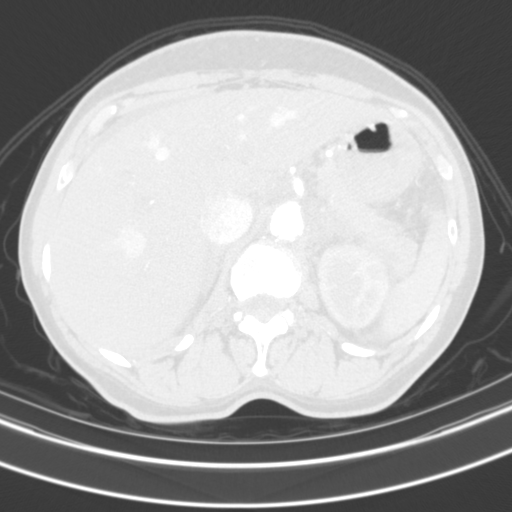
[im 33/149  lung]
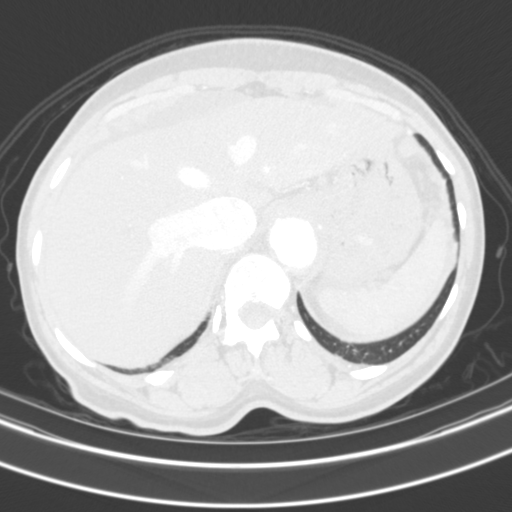
[im 50/149  lung]
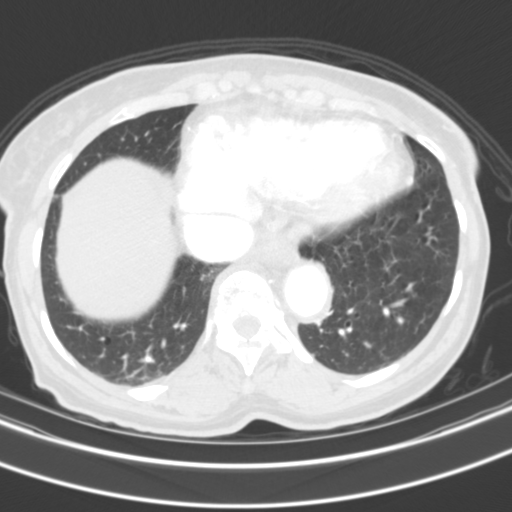
[im 66/149  lung]
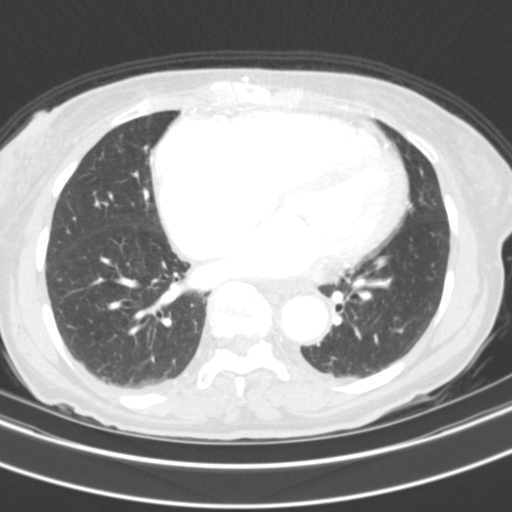
[im 83/149  lung]
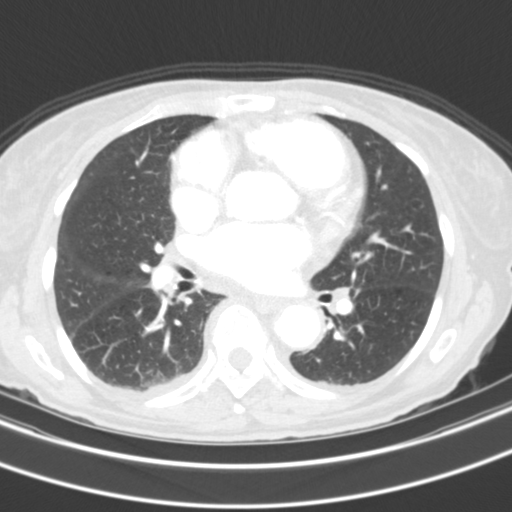

[16 of 36 positions shown; findings below may reference images not displayed]

FINDINGS: Mediastinum/Lymph Nodes: Again noted the nodule and left lobe of
thyroid gland measures 1.6 cm. Further correlation with thyroid
gland ultrasound is recommended.

Central airways are patent. A pre carinal lymph node measures 1.2 cm
short-axis. Atherosclerotic calcifications of thoracic aorta. No
aortic aneurysm. AP window lymph node axial image 40 measures 1.4 cm
short-axis without change from prior exam. Cardiomegaly is noted. No
pericardial effusion. Again noted status post aortic valve
replacement.

Lungs/Pleura: Images of the lung parenchyma shows again bilateral
emphysematous changes more extensive in upper lobes. Mild thickening
of right posterior major fissure with fatty appearance. Mild
atelectasis is noted in superior segment of the right lower lobe.

No infiltrate or pulmonary edema. Axial image 40 stable 4.5 mm
nodule in right upper lobe. No new pulmonary nodules are noted. Mild
atelectasis noted bilateral lower lobe posteriorly.

Upper abdomen: The visualized upper abdomen shows mild fatty
infiltration of the liver. Small amount of perihepatic and
perisplenic ascites. Atherosclerotic calcifications of abdominal
aorta. No adrenal gland mass is noted in visualized upper abdomen.
Visualized upper kidneys are unremarkable.

Musculoskeletal: Sagittal images of the spine shows stable
degenerative changes thoracic spine. No destructive bony lesions are
noted. Status post median sternotomy. The the
IMPRESSION: 1. Again noted bilateral emphysematous changes. No acute infiltrate
or pulmonary edema. Stable 4.5 mm nodule in right upper lobe. No new
pulmonary nodules are noted. No follow-up needed if patient is
low-risk. Non-contrast chest CT can be considered in 12 months if
patient is high-risk. This recommendation follows the consensus
statement: Guidelines for Management of Incidental Pulmonary Nodules
Detected on CT Images:From the [HOSPITAL] 9689; published
online before print (10.1148/[id number]).
2. Stable mild mediastinal adenopathy
3. Degenerative changes thoracic spine again noted.
4. Mild fatty infiltration of the liver. Small perihepatic and
perisplenic ascites.

## 2018-03-09 ENCOUNTER — Other Ambulatory Visit (HOSPITAL_COMMUNITY): Payer: Self-pay | Admitting: Internal Medicine

## 2018-03-09 DIAGNOSIS — R911 Solitary pulmonary nodule: Secondary | ICD-10-CM

## 2018-03-20 ENCOUNTER — Encounter (HOSPITAL_COMMUNITY): Payer: Self-pay

## 2018-03-20 ENCOUNTER — Ambulatory Visit (HOSPITAL_COMMUNITY)
Admission: RE | Admit: 2018-03-20 | Discharge: 2018-03-20 | Disposition: A | Discharge status: Home / Self Care | Payer: Medicare (Managed Care) | Source: Ambulatory Visit | Attending: Internal Medicine | Admitting: Internal Medicine

## 2018-03-20 DIAGNOSIS — I517 Cardiomegaly: Secondary | ICD-10-CM | POA: Diagnosis not present

## 2018-03-20 DIAGNOSIS — I2510 Atherosclerotic heart disease of native coronary artery without angina pectoris: Secondary | ICD-10-CM | POA: Insufficient documentation

## 2018-03-20 DIAGNOSIS — J432 Centrilobular emphysema: Secondary | ICD-10-CM | POA: Diagnosis not present

## 2018-03-20 DIAGNOSIS — I700 Atherosclerosis of aorta: Secondary | ICD-10-CM | POA: Insufficient documentation

## 2018-03-20 DIAGNOSIS — R911 Solitary pulmonary nodule: Secondary | ICD-10-CM | POA: Diagnosis not present

## 2018-03-20 LAB — POCT I-STAT CREATININE: Creatinine, Ser: 1 mg/dL (ref 0.44–1.00)

## 2018-03-20 MED ORDER — IOHEXOL 300 MG/ML  SOLN
75.0000 mL | Freq: Once | INTRAMUSCULAR | Status: AC | PRN
  Administered 2018-03-20: 75 mL via INTRAVENOUS

## 2019-05-07 ENCOUNTER — Ambulatory Visit: Payer: Medicare (Managed Care) | Admitting: Psychology

## 2019-05-13 ENCOUNTER — Ambulatory Visit: Payer: Medicare (Managed Care) | Admitting: Psychology

## 2020-12-29 ENCOUNTER — Other Ambulatory Visit: Payer: Self-pay | Admitting: Vascular Surgery

## 2020-12-29 ENCOUNTER — Ambulatory Visit
Admission: RE | Admit: 2020-12-29 | Discharge: 2020-12-29 | Disposition: A | Discharge status: Home / Self Care | Payer: Medicare (Managed Care) | Source: Ambulatory Visit | Attending: Vascular Surgery | Admitting: Vascular Surgery

## 2020-12-29 DIAGNOSIS — M25511 Pain in right shoulder: Secondary | ICD-10-CM

## 2021-01-07 ENCOUNTER — Other Ambulatory Visit: Payer: Self-pay | Admitting: Vascular Surgery

## 2021-01-07 DIAGNOSIS — R945 Abnormal results of liver function studies: Secondary | ICD-10-CM

## 2021-01-19 ENCOUNTER — Ambulatory Visit
Admission: RE | Admit: 2021-01-19 | Discharge: 2021-01-19 | Disposition: A | Discharge status: Home / Self Care | Payer: Medicare (Managed Care) | Source: Ambulatory Visit | Attending: Vascular Surgery | Admitting: Vascular Surgery

## 2021-01-19 DIAGNOSIS — R945 Abnormal results of liver function studies: Secondary | ICD-10-CM

## 2021-04-01 DEATH — deceased
